# Patient Record
Sex: Female | Born: 1985 | Race: White | Hispanic: No | Marital: Married | Smoking: Current every day smoker
Health system: Southern US, Community
[De-identification: ages and names within clinical notes are randomized; demographics above are authoritative.]

## PROBLEM LIST (undated history)

## (undated) ENCOUNTER — Inpatient Hospital Stay (HOSPITAL_COMMUNITY): Payer: Self-pay

## (undated) DIAGNOSIS — Z720 Tobacco use: Secondary | ICD-10-CM

## (undated) DIAGNOSIS — F329 Major depressive disorder, single episode, unspecified: Secondary | ICD-10-CM

## (undated) DIAGNOSIS — R011 Cardiac murmur, unspecified: Secondary | ICD-10-CM

## (undated) DIAGNOSIS — F32A Depression, unspecified: Secondary | ICD-10-CM

## (undated) DIAGNOSIS — Z789 Other specified health status: Secondary | ICD-10-CM

## (undated) DIAGNOSIS — Z8489 Family history of other specified conditions: Secondary | ICD-10-CM

## (undated) DIAGNOSIS — Z973 Presence of spectacles and contact lenses: Secondary | ICD-10-CM

## (undated) HISTORY — DX: Tobacco use: Z72.0

## (undated) HISTORY — PX: GALLBLADDER SURGERY: SHX652

## (undated) HISTORY — DX: Major depressive disorder, single episode, unspecified: F32.9

## (undated) HISTORY — PX: WISDOM TOOTH EXTRACTION: SHX21

## (undated) HISTORY — DX: Depression, unspecified: F32.A

## (undated) HISTORY — PX: CHOLECYSTECTOMY: SHX55

---
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
## 2004-01-02 ENCOUNTER — Ambulatory Visit (HOSPITAL_COMMUNITY): Admission: RE | Admit: 2004-01-02 | Discharge: 2004-01-02 | Payer: Self-pay | Admitting: Obstetrics & Gynecology

## 2004-03-12 ENCOUNTER — Inpatient Hospital Stay (HOSPITAL_COMMUNITY): Admission: AD | Admit: 2004-03-12 | Discharge: 2004-03-12 | Payer: Self-pay | Admitting: Obstetrics and Gynecology

## 2004-03-15 ENCOUNTER — Inpatient Hospital Stay (HOSPITAL_COMMUNITY): Admission: AD | Admit: 2004-03-15 | Discharge: 2004-03-15 | Payer: Self-pay | Admitting: Obstetrics and Gynecology

## 2004-03-24 ENCOUNTER — Inpatient Hospital Stay (HOSPITAL_COMMUNITY): Admission: AD | Admit: 2004-03-24 | Discharge: 2004-03-27 | Payer: Self-pay | Admitting: Obstetrics & Gynecology

## 2004-03-25 ENCOUNTER — Encounter (INDEPENDENT_AMBULATORY_CARE_PROVIDER_SITE_OTHER): Payer: Self-pay | Admitting: *Deleted

## 2004-05-11 ENCOUNTER — Other Ambulatory Visit: Admission: RE | Admit: 2004-05-11 | Discharge: 2004-05-11 | Payer: Self-pay | Admitting: Obstetrics & Gynecology

## 2004-05-20 ENCOUNTER — Emergency Department (HOSPITAL_COMMUNITY): Admission: EM | Admit: 2004-05-20 | Discharge: 2004-05-20 | Payer: Self-pay | Admitting: Emergency Medicine

## 2004-09-19 ENCOUNTER — Inpatient Hospital Stay (HOSPITAL_COMMUNITY): Admission: EM | Admit: 2004-09-19 | Discharge: 2004-09-21 | Payer: Self-pay | Admitting: Emergency Medicine

## 2004-09-20 ENCOUNTER — Encounter (INDEPENDENT_AMBULATORY_CARE_PROVIDER_SITE_OTHER): Payer: Self-pay | Admitting: Specialist

## 2006-03-10 ENCOUNTER — Emergency Department (HOSPITAL_COMMUNITY): Admission: EM | Admit: 2006-03-10 | Discharge: 2006-03-10 | Payer: Self-pay | Admitting: Emergency Medicine

## 2006-03-11 ENCOUNTER — Emergency Department (HOSPITAL_COMMUNITY): Admission: EM | Admit: 2006-03-11 | Discharge: 2006-03-11 | Payer: Self-pay | Admitting: Family Medicine

## 2006-04-08 ENCOUNTER — Emergency Department (HOSPITAL_COMMUNITY): Admission: EM | Admit: 2006-04-08 | Discharge: 2006-04-08 | Payer: Self-pay | Admitting: Emergency Medicine

## 2006-12-15 ENCOUNTER — Emergency Department (HOSPITAL_COMMUNITY): Admission: EM | Admit: 2006-12-15 | Discharge: 2006-12-15 | Payer: Self-pay | Admitting: Emergency Medicine

## 2007-04-06 ENCOUNTER — Inpatient Hospital Stay (HOSPITAL_COMMUNITY): Admission: AD | Admit: 2007-04-06 | Discharge: 2007-04-06 | Payer: Self-pay | Admitting: Gynecology

## 2007-04-19 ENCOUNTER — Ambulatory Visit: Payer: Self-pay | Admitting: Family Medicine

## 2007-12-30 ENCOUNTER — Inpatient Hospital Stay (HOSPITAL_COMMUNITY): Admission: AD | Admit: 2007-12-30 | Discharge: 2007-12-30 | Payer: Self-pay | Admitting: Obstetrics & Gynecology

## 2008-01-01 ENCOUNTER — Inpatient Hospital Stay (HOSPITAL_COMMUNITY): Admission: AD | Admit: 2008-01-01 | Discharge: 2008-01-01 | Payer: Self-pay | Admitting: Obstetrics & Gynecology

## 2008-01-08 ENCOUNTER — Inpatient Hospital Stay (HOSPITAL_COMMUNITY): Admission: AD | Admit: 2008-01-08 | Discharge: 2008-01-08 | Payer: Self-pay | Admitting: Obstetrics & Gynecology

## 2008-02-27 ENCOUNTER — Inpatient Hospital Stay (HOSPITAL_COMMUNITY): Admission: AD | Admit: 2008-02-27 | Discharge: 2008-02-27 | Payer: Self-pay | Admitting: Obstetrics and Gynecology

## 2008-03-17 ENCOUNTER — Ambulatory Visit: Payer: Self-pay | Admitting: Cardiology

## 2008-03-17 ENCOUNTER — Encounter: Payer: Self-pay | Admitting: Cardiology

## 2008-03-17 DIAGNOSIS — R002 Palpitations: Secondary | ICD-10-CM | POA: Insufficient documentation

## 2008-03-17 DIAGNOSIS — R55 Syncope and collapse: Secondary | ICD-10-CM | POA: Insufficient documentation

## 2008-03-20 ENCOUNTER — Ambulatory Visit: Payer: Self-pay | Admitting: Cardiology

## 2008-03-31 ENCOUNTER — Ambulatory Visit: Payer: Self-pay

## 2008-03-31 ENCOUNTER — Encounter: Payer: Self-pay | Admitting: Cardiology

## 2008-04-29 DIAGNOSIS — R000 Tachycardia, unspecified: Secondary | ICD-10-CM

## 2008-04-29 DIAGNOSIS — R011 Cardiac murmur, unspecified: Secondary | ICD-10-CM

## 2008-07-01 ENCOUNTER — Inpatient Hospital Stay (HOSPITAL_COMMUNITY): Admission: AD | Admit: 2008-07-01 | Discharge: 2008-07-01 | Payer: Self-pay | Admitting: Obstetrics and Gynecology

## 2008-08-31 ENCOUNTER — Inpatient Hospital Stay (HOSPITAL_COMMUNITY): Admission: AD | Admit: 2008-08-31 | Discharge: 2008-09-02 | Payer: Self-pay | Admitting: Obstetrics and Gynecology

## 2009-05-08 ENCOUNTER — Emergency Department (HOSPITAL_COMMUNITY): Admission: EM | Admit: 2009-05-08 | Discharge: 2009-05-08 | Payer: Self-pay | Admitting: Pediatric Emergency Medicine

## 2010-04-17 LAB — CBC
HCT: 28.2 % — ABNORMAL LOW (ref 36.0–46.0)
HCT: 32.2 % — ABNORMAL LOW (ref 36.0–46.0)
Hemoglobin: 11.3 g/dL — ABNORMAL LOW (ref 12.0–15.0)
MCHC: 35.1 g/dL (ref 30.0–36.0)
MCHC: 35.3 g/dL (ref 30.0–36.0)
MCV: 93.7 fL (ref 78.0–100.0)
Platelets: 169 10*3/uL (ref 150–400)
RBC: 3.01 MIL/uL — ABNORMAL LOW (ref 3.87–5.11)
RBC: 3.5 MIL/uL — ABNORMAL LOW (ref 3.87–5.11)
WBC: 10.2 10*3/uL (ref 4.0–10.5)

## 2010-04-17 LAB — RH IMMUNE GLOB WKUP(>/=20WKS)(NOT WOMEN'S HOSP)

## 2010-04-19 LAB — DIFFERENTIAL
Basophils Absolute: 0 10*3/uL (ref 0.0–0.1)
Basophils Relative: 0 % (ref 0–1)
Eosinophils Relative: 2 % (ref 0–5)
Lymphocytes Relative: 16 % (ref 12–46)
Monocytes Absolute: 0.5 10*3/uL (ref 0.1–1.0)

## 2010-04-19 LAB — URINALYSIS, ROUTINE W REFLEX MICROSCOPIC
Protein, ur: NEGATIVE mg/dL
Specific Gravity, Urine: 1.01 (ref 1.005–1.030)
Urobilinogen, UA: 1 mg/dL (ref 0.0–1.0)

## 2010-04-19 LAB — COMPREHENSIVE METABOLIC PANEL
ALT: 9 U/L (ref 0–35)
AST: 17 U/L (ref 0–37)
Albumin: 2.8 g/dL — ABNORMAL LOW (ref 3.5–5.2)
Alkaline Phosphatase: 92 U/L (ref 39–117)
Chloride: 106 mEq/L (ref 96–112)
GFR calc Af Amer: >60 mL/min (ref >60)
Potassium: 3.6 mEq/L (ref 3.5–5.1)
Sodium: 136 mEq/L (ref 135–145)
Total Bilirubin: 0.8 mg/dL (ref 0.3–1.2)

## 2010-04-19 LAB — CBC
Platelets: 195 10*3/uL (ref 150–400)
WBC: 10.2 10*3/uL (ref 4.0–10.5)

## 2010-04-27 LAB — CBC
MCV: 91.2 fL (ref 78.0–100.0)
RBC: 3.6 MIL/uL — ABNORMAL LOW (ref 3.87–5.11)
WBC: 10.8 10*3/uL — ABNORMAL HIGH (ref 4.0–10.5)

## 2010-04-27 LAB — COMPREHENSIVE METABOLIC PANEL
ALT: 13 U/L (ref 0–35)
AST: 17 U/L (ref 0–37)
Alkaline Phosphatase: 35 U/L — ABNORMAL LOW (ref 39–117)
CO2: 21 mEq/L (ref 19–32)
Chloride: 100 mEq/L (ref 96–112)
GFR calc Af Amer: >60 mL/min (ref >60)
GFR calc non Af Amer: >60 mL/min (ref >60)
Potassium: 3.1 mEq/L — ABNORMAL LOW (ref 3.5–5.1)
Sodium: 130 mEq/L — ABNORMAL LOW (ref 135–145)
Total Bilirubin: 0.3 mg/dL (ref 0.3–1.2)

## 2010-04-27 LAB — URINALYSIS, ROUTINE W REFLEX MICROSCOPIC
Bilirubin Urine: NEGATIVE
Ketones, ur: NEGATIVE mg/dL
Nitrite: NEGATIVE
pH: 6.5 (ref 5.0–8.0)

## 2010-04-27 LAB — URINE MICROSCOPIC-ADD ON

## 2010-05-28 NOTE — H&P (Signed)
[name], [name]                ACCOUNT NO.:  [id number]   MEDICAL RECORD NO.:  [id number]          PATIENT TYPE:  INP   LOCATION:  1505                         FACILITY:  [facility]   PHYSICIAN:  John C. Madilyn Fireman, M.D.    DATE OF BIRTH:  1985/01/17   DATE OF ADMISSION:  09/19/2004  DATE OF DISCHARGE:                                HISTORY & PHYSICAL   CHIEF COMPLAINT:  Abdominal pain.   HISTORY OF ILLNESS:  The patient is a 25 year old white female who presents  with recurrent epigastric abdominal pain. She has been having episodes of  various intensity for the last 6 months and she had a fairly severe episode  beginning about 9 a.m. this morning with no radiation, no nausea or  vomiting. She came to the emergency room, had an abdominal ultrasound which  showed multiple gallstones and no gallbladder wall thickening or  pericholecystic fluid but some possible mild common bile duct diltation. She  also had slightly elevated liver function tests. She has had one or two ER  visits over the last 6 months for similar pain but had never had an  abdominal ultrasound. She was treated with Nexium for presumed reflux on one  occasion.   PAST MEDICAL HISTORY:  Essentially unremarkable.   SURGERIES:  None.   ALLERGIES:  Questionable allergy to a GI COCKTAIL.   SOCIAL HISTORY:  The patient is single. She denies alcohol or tobacco use.   FAMILY HISTORY:  Noncontributory. Father died of coronary artery disease.   PHYSICAL EXAMINATION:  GENERAL:  Well-developed, well-nourished, moderately-  obese white female in no acute distress. She is afebrile.  HEENT:  Unremarkable. No scleral icterus.  HEART:  Regular rate and rhythm without murmur.  LUNGS:  Clear.  ABDOMEN:  Soft, nondistended, with normoactive bowel sounds. There is mild  epigastric left upper quadrant and right upper quadrant tenderness.   LABORATORY DATA:  WBC normal. Lipase slightly elevated at 65. ALT 119, AST  102, alkaline  phosphatase and bilirubin normal.   IMPRESSION:  Symptomatic cholelithiasis, possible choledocholithiasis.   PLAN:  Will admit, consult general surgery, and decide whether to proceed  with ERCP or a laparoscopic cholecystectomy.           ______________________________  Everardo All Madilyn Fireman, M.D.     JCH/MEDQ  D:  09/19/2004  T:  09/20/2004  Job:  [id number]   cc:   Freddy Finner, M.D.  Fax: [phone]

## 2010-05-28 NOTE — Op Note (Signed)
[name], [name]                ACCOUNT NO.:  [id number]   MEDICAL RECORD NO.:  [id number]          PATIENT TYPE:  INP   LOCATION:  1505                         FACILITY:  [facility]   PHYSICIAN:  Angelia Mould. Derrell Lolling, M.D.DATE OF BIRTH:  Jul 01, 1985   DATE OF PROCEDURE:  09/20/2004  DATE OF DISCHARGE:                                 OPERATIVE REPORT   PREOPERATIVE DIAGNOSIS:  Chronic cholecystitis with cholelithiasis.   POSTOPERATIVE DIAGNOSIS:  Chronic cholecystitis with cholelithiasis.   OPERATION PERFORMED:  Laparoscopic cholecystectomy with intraoperative  cholangiogram.   SURGEON:  Angelia Mould. Derrell Lolling, M.D.   FIRST ASSISTANT:  Adolph Pollack, M.D.   OPERATIVE INDICATIONS:  This is a 25 year old white female who has a 17-month  history of intermittent episodes of biliary colic. She had the most severe  episode of a her life last night, came to the emergency room and was found  to have mildly elevated liver function tests and an ultrasound which  revealed gallstones and a slightly dilated biliary tree. Overnight she  became asymptomatic, her liver function test almost completely normalized  and her white blood cell count became normal. Her care was discussed with  Dr. Dorena Cookey. We agreed that the next step was to proceed with  cholecystectomy with the thought that she might need ERCP postop if we found  retained common bile duct stones.   OPERATIVE FINDINGS:  The patient had a chronically inflamed gallbladder. It  was slightly edematous but not severely inflamed. The anatomy of the cystic  duct, cystic artery and common bile duct were conventional. The  cholangiogram showed a dilated biliary tree, but there was no obstruction  and no filling defect. This was reviewed with the radiologist  intraoperatively. Otherwise the stomach, duodenum, small intestine, large  intestine and peritoneal surfaces looked normal. The liver looked healthy.   OPERATIVE TECHNIQUE:  Following the  induction of general endotracheal  anesthesia, the patient's abdomen was prepped and draped in a sterile  fashion. Marcaine 0.5% with epinephrine was used as a local infiltration  anesthetic. A transverse incision was made at the lower rim of the  umbilicus. The fascia was incised in the midline and the abdominal cavity  entered under direct vision. A 10 mm Hassan trocar was inserted and secured  with a pursestring suture of zero Vicryl. Pneumoperitoneum was created. A  video camera was inserted with visualization and findings as described  above. A 10 mm trocar was placed in the subxiphoid region and two 5 mm  trocars placed in the right mid abdomen. The gallbladder was elevated with  graspers. I dissected out the cystic duct and the cystic artery. A  cholangiogram catheter was inserted into the cystic duct. The cholangiogram  showed a normal anatomy of the biliary tree, although the biliary tree was  somewhat dilated. There were no filling defects, there was no obstruction  with good flow of contrast into the duodenum. The cholangiogram catheter was  removed. The cystic duct was secured with multiple metal clips and divided.  The cystic artery was secured with multiple metal clips and divided. I  found  a small posterior branch of the cystic artery as well and that was secured  with metal clips and divided. The gallbladder was then dissected from its  bed with electrocautery, placed in specimen bag and removed.   The operative field was copiously irrigated with saline. Hemostasis was  excellent and achieved with electrocautery. At the completion of the case,  the irrigation fluid was completely clear and there was no bleeding or bile  leak. The trocars were removed under direct vision and there was no bleeding  from trocar sites. The pneumoperitoneum was released. The fascia at the  umbilicus was closed with zero Vicryl sutures. The skin incision were closed  with subcuticular sutures of  4-0 Monocryl and Steri-Strips. Clean bandages  were placed and the patient taken to the recovery room in stable condition.  Estimated blood loss about 15 mL. Complications none. Sponge, instrument and  needle counts  counts were correct.      Angelia Mould. Derrell Lolling, M.D.  Electronically Signed     HMI/MEDQ  D:  09/20/2004  T:  09/20/2004  Job:  [id number]   cc:   Everardo All. Madilyn Fireman, M.D.  [address]  [place]  [place] [address]  Fax: [phone]

## 2010-05-28 NOTE — Consult Note (Signed)
[name], [name]                ACCOUNT NO.:  [id number]   MEDICAL RECORD NO.:  [id number]          PATIENT TYPE:  INP   LOCATION:  1505                         FACILITY:  [facility]   PHYSICIAN:  Angelia Mould. Derrell Lolling, M.D.DATE OF BIRTH:  10/21/1985   DATE OF CONSULTATION:  09/20/2004  DATE OF DISCHARGE:                                   CONSULTATION   REASON FOR CONSULTATION:  Evaluate abdominal pain and gallstones.   HISTORY OF PRESENT ILLNESS:  This is a 25 year old white female who gives a  6 month history of intermittent episodes of epigastric pain and nausea. She  had a severe attack yesterday and came to the emergency room last night and  was admitted by Dr. Dorena Cookey. She is now asymptomatic, denies pain or  nausea.   Her workup in the emergency room included an ultrasound which shows  gallstones and common bile duct dilated to 8 mm, but the gallbladder was not  obviously inflamed. Lab work showed a white blood cell count of 6500, SGOT  of 102 and SGPT of 119 slightly elevated, total bilirubin 0.6, lipase  slightly elevated at 65.   She was admitted by Dr. Dorena Cookey who is contemplating ERCP depending on  clinical course. I was asked to see her to assist with timing of  cholecystectomy.   PAST MEDICAL HISTORY:  She has had her wisdom teeth removed. She was told  she had a heart murmur when she was pregnant but has no real history of any  cardiac disease. She has had 2 pregnancies and 2 deliveries.   CURRENT MEDICATIONS:  None.   ALLERGIES:  She is allergic to a GI cocktail which caused the skin rash.   SOCIAL HISTORY:  She lives in [place] with her husband and 2 children.  She denies the use of alcohol or tobacco. She is a housewife.   FAMILY HISTORY:  Mother living and well, father deceased, had a myocardial  infarction and diabetes. She has 6 siblings, 1 sister had a seizure  disorder.   REVIEW OF SYMPTOMS:  A 15 system review of systems is performed and is  noncontributory except as described above.   PHYSICAL EXAMINATION:  GENERAL:  A healthy appearing young white female in  no obvious distress.  VITAL SIGNS:  Temperature 98.5, blood pressure 121/74, pulse 65,  respirations 20. Oxygen saturation 99% on room air.  HEENT:  Eyes, sclera clear, extraocular movements intact. Ears, nose, mouth,  throat, nose, lips, tongue and oropharynx are without gross lesions.  NECK:  Supple, nontender, no mass, no jugular venous distention.  LUNGS:  Clear to auscultation, no chest wall tenderness.  BREASTS:  Not examined.  HEART:  Regular rate and rhythm, perhaps a faint systolic ejection murmur  but nothing dramatic. Radial, femoral and posterior tibial pulses are  palpable, no peripheral edema.  ABDOMEN:  Soft and nontender. Liver and spleen not enlarged. No mass. Not  distended, no hernia.  EXTREMITIES:  She moves all four extremities well without pain or deformity.  NEUROLOGIC:  No gross motor or sensory deficits.   ADMISSION DATA:  Lab work and ultrasound are described above.   IMPRESSION:  1.  Chronic cholecystitis with cholelithiasis now with accelerating biliary      colic.  2.  Mild elevation of liver function tests and mild dilatation of common      bile duct on ultrasound. Question whether she may have retained common      bile duct stones.   PLAN:  1.  The patient will be started on intravenous antibiotics in case there is      an inflammatory component to this.  2.  We will repeat her lab work and clinical assessment tomorrow morning. If      her liver function tests worsen, we should consider preop ERCP. If      things improve, then we might consider cholecystectomy tomorrow.   I discussed the indications and details of cholecystectomy with the patient  and her husband. The risks and complications have been outlined, including  but not limited to bleeding, infection, conversion to open laparotomy,  injury to adjacent organs such as the  main bile duct or intestine with major  reconstructive surgery, wound problems such as infection or hernia, cardiac,  pulmonary and thromboembolic problems. She seems to understand all these  issues well. At this time, all of her questions are answered. She is in full  agreement with this plan.      Angelia Mould. Derrell Lolling, M.D.  Electronically Signed     HMI/MEDQ  D:  09/20/2004  T:  09/20/2004  Job:  [id number]   cc:   Everardo All. Madilyn Fireman, M.D.  [address]  [place]  [place] [address]  Fax: [phone]

## 2010-05-28 NOTE — Discharge Summary (Signed)
NAMEKYLYN, MCDADE                ACCOUNT NO.:  [id number]   MEDICAL RECORD NO.:  [id number]          PATIENT TYPE:  INP   LOCATION:  9101                          FACILITY:  [facility]   PHYSICIAN:  Miguel Aschoff, M.D.       DATE OF BIRTH:  [birth year]   DATE OF ADMISSION:  09/12/2003  DATE OF DISCHARGE:  09/12/2003                                 DISCHARGE SUMMARY   ADMISSION DIAGNOSES:  1.  Intrauterine pregnancy at 12 weeks.  2.  Hyperemesis with tachycardia and orthostatic changes.   HISTORY OF PRESENT ILLNESS:  The patient is a 25 year old white female  gravida 2, para 1-0-0-1 at approximately [redacted] weeks gestation.  She developed  symptoms of feeling weak and faint while standing and then became very  nauseated.  The patient was brought to the [facility] emergency department where  she was treated with intravenous fluids and then [facility] to [facility] for further evaluation and treatment.  It was elected to start the  patient on IV fluids and to control her nausea with intravenous Phenergan.  Hydration continued with improvement of her symptoms, and the patient was  able to be discharged home.   DISCHARGE MEDICATIONS:  Phenergan orally.   FOLLOW UP:  She was instructed to follow up with her physicians at  [facility] and to call if there are any renewed symptoms.      AR/MEDQ  D:  10/26/2003  T:  10/27/2003  Job:  [id number]

## 2010-10-14 LAB — POCT PREGNANCY, URINE: Preg Test, Ur: POSITIVE

## 2010-10-14 LAB — URINALYSIS, ROUTINE W REFLEX MICROSCOPIC
Bilirubin Urine: NEGATIVE
Hgb urine dipstick: NEGATIVE
Specific Gravity, Urine: 1.02 (ref 1.005–1.030)
pH: 5.5 (ref 5.0–8.0)

## 2010-10-14 LAB — WET PREP, GENITAL
Trich, Wet Prep: NONE SEEN
Yeast Wet Prep HPF POC: NONE SEEN

## 2010-10-14 LAB — ABO/RH: ABO/RH(D): A NEG

## 2010-10-14 LAB — CBC
Hemoglobin: 13.1 g/dL (ref 12.0–15.0)
MCHC: 35.1 g/dL (ref 30.0–36.0)
MCV: 96.1 fL (ref 78.0–100.0)
RBC: 3.87 MIL/uL (ref 3.87–5.11)
WBC: 6.9 10*3/uL (ref 4.0–10.5)

## 2010-10-14 LAB — GC/CHLAMYDIA PROBE AMP, GENITAL: GC Probe Amp, Genital: NEGATIVE

## 2010-11-03 ENCOUNTER — Encounter (HOSPITAL_COMMUNITY): Payer: Self-pay | Admitting: *Deleted

## 2010-11-03 ENCOUNTER — Inpatient Hospital Stay (HOSPITAL_COMMUNITY)
Admission: AD | Admit: 2010-11-03 | Discharge: 2010-11-03 | Disposition: A | Discharge status: Home / Self Care | Payer: Self-pay | Source: Ambulatory Visit | Attending: Obstetrics and Gynecology | Admitting: Obstetrics and Gynecology

## 2010-11-03 DIAGNOSIS — N946 Dysmenorrhea, unspecified: Secondary | ICD-10-CM | POA: Insufficient documentation

## 2010-11-03 DIAGNOSIS — R109 Unspecified abdominal pain: Secondary | ICD-10-CM | POA: Insufficient documentation

## 2010-11-03 HISTORY — DX: Other specified health status: Z78.9

## 2010-11-03 LAB — URINE MICROSCOPIC-ADD ON

## 2010-11-03 LAB — URINALYSIS, ROUTINE W REFLEX MICROSCOPIC
Bilirubin Urine: NEGATIVE
Glucose, UA: NEGATIVE mg/dL
Ketones, ur: NEGATIVE mg/dL
Protein, ur: NEGATIVE mg/dL

## 2010-11-03 LAB — WET PREP, GENITAL: Yeast Wet Prep HPF POC: NONE SEEN

## 2010-11-03 MED ORDER — IBUPROFEN 600 MG PO TABS: 600.0000 mg | ORAL_TABLET | Freq: Four times a day (QID) | ORAL | Status: AC | PRN

## 2010-11-03 NOTE — Progress Notes (Signed)
Patient states she started her period on 10-20. Discovered this am that she left her tampon in and removed it. Has a foul odor with slight discharge and cramping. Has had nausea all day today with vomiting x 1.

## 2010-11-03 NOTE — Progress Notes (Signed)
Pt states she had a tampon that was left in x2 days, fell out this am. Pt started her cycle on 10/20 and is only spotting at this time.  Pt states she has x1 episode of n/v and clear, odorous discharge. No fever.

## 2010-11-03 NOTE — ED Provider Notes (Signed)
History     CSN: [id number] Arrival date & time: 11/03/2010  5:54 PM   None     Chief Complaint  Patient presents with  . Vaginal Discharge  . Abdominal Cramping    HPI [name] is a 25 y.o. female who presents to [facility]U after realizing she had left a tampon in for 2 days. She is having her period and started cramping as usual when it started and is still having some cramping. She came because she was afraid of toxic shock syndrome. She denies fever, chills or other problems.   Past Medical History  Diagnosis Date  . No pertinent past medical history     Past Surgical History  Procedure Date  . Gallbladder surgery     No family history on file.  History  Substance Use Topics  . Smoking status: Current Everyday Smoker -- 1.0 packs/day  . Smokeless tobacco: Not on file  . Alcohol Use: No    OB History    Grav Para Term Preterm Abortions TAB SAB Ect Mult Living   3 3 3       3       Review of Systems  Gastrointestinal:       Abdominal cramping.  Genitourinary: Positive for vaginal bleeding and vaginal discharge.  Musculoskeletal: Positive for back pain.  All other systems reviewed and are negative.    Allergies  Donnatal; Lidocaine viscous; and Maalox  Home Medications  No current outpatient prescriptions on file.  BP 115/74  Pulse 80  Temp(Src) 98.9 F (37.2 C) (Oral)  Resp 16  Ht 5' 2.5" (1.588 m)  Wt 177 lb (80.287 kg)  BMI 31.86 kg/m2  SpO2 97%  LMP 10/30/2010  Physical Exam  Nursing note and vitals reviewed. Constitutional: She is oriented to person, place, and time. She appears well-developed and well-nourished.  HENT:  Head: Normocephalic and atraumatic.  Eyes: EOM are normal.  Neck: Neck supple.  Cardiovascular: Normal rate.   Pulmonary/Chest: Effort normal.  Abdominal: Soft. There is no tenderness.  Genitourinary:       External genitalia without lesions. Small amount of vaginal bleeding. No CMT, no adnexal tenderness. Uterus  without palpable enlargement.  Musculoskeletal: Normal range of motion.  Neurological: She is alert and oriented to person, place, and time. No cranial nerve deficit.  Skin: Skin is warm and dry.   Assessment:  Dysmenorrhea  Plan:   Ibuprofen 600 mg. Every 6 hours as needed    Cultures pending    Follow up with GYN, return here as needed. ED Course  Procedures Results for orders placed during the hospital encounter of 11/03/10 (from the past 24 hour(s))  URINALYSIS, ROUTINE W REFLEX MICROSCOPIC     Status: Abnormal   Collection Time   11/03/10  6:15 PM      Component Value Range   Color, Urine YELLOW  YELLOW    Appearance CLEAR  CLEAR    Specific Gravity, Urine >1.030 (*) 1.005 - 1.030    pH 6.0  5.0 - 8.0    Glucose, UA NEGATIVE  NEGATIVE (mg/dL)   Hgb urine dipstick MODERATE (*) NEGATIVE    Bilirubin Urine NEGATIVE  NEGATIVE    Ketones, ur NEGATIVE  NEGATIVE (mg/dL)   Protein, ur NEGATIVE  NEGATIVE (mg/dL)   Urobilinogen, UA 0.2  0.0 - 1.0 (mg/dL)   Nitrite NEGATIVE  NEGATIVE    Leukocytes, UA NEGATIVE  NEGATIVE   URINE MICROSCOPIC-ADD ON     Status: Abnormal  Collection Time   11/03/10  6:15 PM      Component Value Range   Squamous Epithelial / LPF FEW (*) RARE    WBC, UA 0-2  <3 (WBC/hpf)  WET PREP, GENITAL     Status: Abnormal   Collection Time   11/03/10  6:45 PM      Component Value Range   Yeast, Wet Prep NONE SEEN  NONE SEEN    Trich, Wet Prep NONE SEEN  NONE SEEN    Clue Cells, Wet Prep NONE SEEN  NONE SEEN    WBC, Wet Prep HPF POC MODERATE (*) NONE SEEN           [place], NP 11/03/10 1907

## 2010-11-04 LAB — GC/CHLAMYDIA PROBE AMP, GENITAL: GC Probe Amp, Genital: NEGATIVE

## 2010-11-08 NOTE — ED Provider Notes (Signed)
Agree with above note.  [name] 11/08/2010 11:38 AM

## 2010-11-13 ENCOUNTER — Inpatient Hospital Stay (INDEPENDENT_AMBULATORY_CARE_PROVIDER_SITE_OTHER)
Admission: RE | Admit: 2010-11-13 | Discharge: 2010-11-13 | Disposition: A | Discharge status: Home / Self Care | Payer: BC Managed Care – PPO | Source: Ambulatory Visit | Attending: Family Medicine | Admitting: Family Medicine

## 2010-11-13 ENCOUNTER — Ambulatory Visit (INDEPENDENT_AMBULATORY_CARE_PROVIDER_SITE_OTHER): Payer: BC Managed Care – PPO

## 2010-11-13 DIAGNOSIS — J40 Bronchitis, not specified as acute or chronic: Secondary | ICD-10-CM

## 2012-04-05 ENCOUNTER — Encounter (HOSPITAL_COMMUNITY): Payer: Self-pay | Admitting: Emergency Medicine

## 2012-04-05 ENCOUNTER — Emergency Department (HOSPITAL_COMMUNITY): Payer: BC Managed Care – PPO

## 2012-04-05 ENCOUNTER — Emergency Department (HOSPITAL_COMMUNITY)
Admission: EM | Admit: 2012-04-05 | Discharge: 2012-04-05 | Disposition: A | Discharge status: Home / Self Care | Payer: BC Managed Care – PPO | Attending: Emergency Medicine | Admitting: Emergency Medicine

## 2012-04-05 DIAGNOSIS — Y9389 Activity, other specified: Secondary | ICD-10-CM | POA: Insufficient documentation

## 2012-04-05 DIAGNOSIS — M545 Low back pain: Secondary | ICD-10-CM

## 2012-04-05 DIAGNOSIS — Y92410 Unspecified street and highway as the place of occurrence of the external cause: Secondary | ICD-10-CM | POA: Insufficient documentation

## 2012-04-05 DIAGNOSIS — F17200 Nicotine dependence, unspecified, uncomplicated: Secondary | ICD-10-CM | POA: Insufficient documentation

## 2012-04-05 DIAGNOSIS — IMO0002 Reserved for concepts with insufficient information to code with codable children: Secondary | ICD-10-CM | POA: Insufficient documentation

## 2012-04-05 MED ORDER — IBUPROFEN 600 MG PO TABS: 600.0000 mg | ORAL_TABLET | Freq: Four times a day (QID) | ORAL | Status: DC | PRN

## 2012-04-05 MED ORDER — METHOCARBAMOL 500 MG PO TABS: 1000.0000 mg | ORAL_TABLET | Freq: Four times a day (QID) | ORAL | Status: DC

## 2012-04-05 MED ORDER — IBUPROFEN 200 MG PO TABS: 600.0000 mg | ORAL_TABLET | Freq: Once | ORAL | Status: DC

## 2012-04-05 NOTE — ED Notes (Signed)
Patient transported to X-ray 
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
## 2012-04-05 NOTE — ED Provider Notes (Signed)
Medical screening examination/treatment/procedure(s) were performed by non-physician practitioner and as supervising physician I was immediately available for consultation/collaboration. Berley Gambrell, MD, FACEP   Freida Nebel L Devony Mcgrady, MD 04/05/12 2030 
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
## 2012-04-05 NOTE — ED Notes (Signed)
MD at bedside. 

## 2013-06-27 LAB — OB RESULTS CONSOLE GC/CHLAMYDIA
CHLAMYDIA, DNA PROBE: NEGATIVE
GC PROBE AMP, GENITAL: NEGATIVE

## 2013-06-27 LAB — OB RESULTS CONSOLE RPR: RPR: NONREACTIVE

## 2013-06-27 LAB — OB RESULTS CONSOLE RUBELLA ANTIBODY, IGM: Rubella: IMMUNE

## 2013-06-27 LAB — OB RESULTS CONSOLE HIV ANTIBODY (ROUTINE TESTING): HIV: NONREACTIVE

## 2013-06-27 LAB — OB RESULTS CONSOLE HEPATITIS B SURFACE ANTIGEN: HEP B S AG: NEGATIVE

## 2013-06-27 LAB — OB RESULTS CONSOLE ABO/RH: RH TYPE: NEGATIVE

## 2013-06-27 LAB — OB RESULTS CONSOLE ANTIBODY SCREEN: ANTIBODY SCREEN: NEGATIVE

## 2013-11-05 ENCOUNTER — Emergency Department (INDEPENDENT_AMBULATORY_CARE_PROVIDER_SITE_OTHER)
Admission: EM | Admit: 2013-11-05 | Discharge: 2013-11-05 | Disposition: A | Discharge status: Home / Self Care | Payer: BC Managed Care – PPO | Source: Home / Self Care | Attending: Family Medicine | Admitting: Family Medicine

## 2013-11-05 ENCOUNTER — Encounter (HOSPITAL_COMMUNITY): Payer: Self-pay | Admitting: Emergency Medicine

## 2013-11-05 DIAGNOSIS — R000 Tachycardia, unspecified: Secondary | ICD-10-CM

## 2013-11-05 DIAGNOSIS — O99330 Smoking (tobacco) complicating pregnancy, unspecified trimester: Secondary | ICD-10-CM

## 2013-11-05 DIAGNOSIS — J40 Bronchitis, not specified as acute or chronic: Secondary | ICD-10-CM

## 2013-11-05 MED ORDER — ALBUTEROL SULFATE (2.5 MG/3ML) 0.083% IN NEBU
2.5000 mg | INHALATION_SOLUTION | Freq: Once | RESPIRATORY_TRACT | Status: AC
  Administered 2013-11-05: 2.5 mg via RESPIRATORY_TRACT

## 2013-11-05 MED ORDER — ALBUTEROL SULFATE HFA 108 (90 BASE) MCG/ACT IN AERS: 2.0000 | INHALATION_SPRAY | Freq: Four times a day (QID) | RESPIRATORY_TRACT | Status: AC | PRN

## 2013-11-05 MED ORDER — ALBUTEROL SULFATE (2.5 MG/3ML) 0.083% IN NEBU
INHALATION_SOLUTION | RESPIRATORY_TRACT | Status: AC
  Filled 2013-11-05: qty 3

## 2013-11-05 MED ORDER — PREDNISONE 10 MG PO TABS: 30.0000 mg | ORAL_TABLET | Freq: Every day | ORAL | Status: DC

## 2013-11-05 NOTE — Discharge Instructions (Signed)
Thank you for coming in today. Take both prednisone and albuterol. These medications can sometimes cause problems with pregnancies but I believe the risk is outweighed by the benefit. If you have questions please discuss with your OB/GYN doctor. Please cut back or quit smoking. You can take Tylenol for pain as needed.   Call or go to the emergency room if you get worse, have trouble breathing, have chest pains, or palpitations.   Acute Bronchitis Bronchitis is inflammation of the airways that extend from the windpipe into the lungs (bronchi). The inflammation often causes mucus to develop. This leads to a cough, which is the most common symptom of bronchitis.  In acute bronchitis, the condition usually develops suddenly and goes away over time, usually in a couple weeks. Smoking, allergies, and asthma can make bronchitis worse. Repeated episodes of bronchitis may cause further lung problems.  CAUSES Acute bronchitis is most often caused by the same virus that causes a cold. The virus can spread from person to person (contagious) through coughing, sneezing, and touching contaminated objects. SIGNS AND SYMPTOMS   Cough.   Fever.   Coughing up mucus.   Body aches.   Chest congestion.   Chills.   Shortness of breath.   Sore throat.  DIAGNOSIS  Acute bronchitis is usually diagnosed through a physical exam. Your health care provider will also ask you questions about your medical history. Tests, such as chest X-rays, are sometimes done to rule out other conditions.  TREATMENT  Acute bronchitis usually goes away in a couple weeks. Oftentimes, no medical treatment is necessary. Medicines are sometimes given for relief of fever or cough. Antibiotic medicines are usually not needed but may be prescribed in certain situations. In some cases, an inhaler may be recommended to help reduce shortness of breath and control the cough. A cool mist vaporizer may also be used to help thin  bronchial secretions and make it easier to clear the chest.  HOME CARE INSTRUCTIONS  Get plenty of rest.   Drink enough fluids to keep your urine clear or pale yellow (unless you have a medical condition that requires fluid restriction). Increasing fluids may help thin your respiratory secretions (sputum) and reduce chest congestion, and it will prevent dehydration.   Take medicines only as directed by your health care provider.  If you were prescribed an antibiotic medicine, finish it all even if you start to feel better.  Avoid smoking and secondhand smoke. Exposure to cigarette smoke or irritating chemicals will make bronchitis worse. If you are a smoker, consider using nicotine gum or skin patches to help control withdrawal symptoms. Quitting smoking will help your lungs heal faster.   Reduce the chances of another bout of acute bronchitis by washing your hands frequently, avoiding people with cold symptoms, and trying not to touch your hands to your mouth, nose, or eyes.   Keep all follow-up visits as directed by your health care provider.  SEEK MEDICAL CARE IF: Your symptoms do not improve after 1 week of treatment.  SEEK IMMEDIATE MEDICAL CARE IF:  You develop an increased fever or chills.   You have chest pain.   You have severe shortness of breath.  You have bloody sputum.   You develop dehydration.  You faint or repeatedly feel like you are going to pass out.  You develop repeated vomiting.  You develop a severe headache. MAKE SURE YOU:   Understand these instructions.  Will watch your condition.  Will get help right away if  you are not doing well or get worse. Document Released: 02/04/2004 Document Revised: 05/13/2013 Document Reviewed: 06/19/2012 [facility] Patient Information 2015 [place], [place]. This information is not intended to replace advice given to you by your health care provider. Make sure you discuss any questions you have with your health care  provider.

## 2013-11-05 NOTE — ED Notes (Signed)
C/o  Productive cough with thick green sputum.  Left ear pain.  States "so stuffy at night can't breath".   Denies fever, n/v/d.  No otc treatments tried.  Pt is 28 wks. Pregnant.

## 2013-11-05 NOTE — ED Provider Notes (Signed)
Christina Porteousatricia L Stoltzfus is a 28 y.o. female who presents to Urgent Care today for cough congestion and ear pain. Patient has a one-week history of mild cough and a 3 day history of productive cough associated with wheezing and occasional shortness of breath and left ear pain. She denies any chest pain or palpitations. No fevers or chills nausea vomiting or diarrhea. No history of asthma. Patient is a smoker.   Past Medical History  Diagnosis Date  . No pertinent past medical history    History  Substance Use Topics  . Smoking status: Current Every Day Smoker -- 1.00 packs/day  . Smokeless tobacco: Not on file  . Alcohol Use: No   ROS as above Medications: Current Facility-Administered Medications  Medication Dose Route Frequency Provider Last Rate Last Dose  . albuterol (PROVENTIL) (2.5 MG/3ML) 0.083% nebulizer solution 2.5 mg  2.5 mg Nebulization Once Rodolph BongEvan S Talani Brazee, MD       Current Outpatient Prescriptions  Medication Sig Dispense Refill  . ibuprofen (ADVIL,MOTRIN) 600 MG tablet Take 1 tablet (600 mg total) by mouth every 6 (six) hours as needed for pain.  20 tablet  0  . methocarbamol (ROBAXIN) 500 MG tablet Take 2 tablets (1,000 mg total) by mouth 4 (four) times daily.  20 tablet  0    Exam:  BP 109/58  Pulse 120  Temp(Src) 98.3 F (36.8 C) (Oral)  Resp 16  SpO2 98% Gen: Well NAD HEENT: EOMI,  MMM normal tympanic membrane and posterior pharynx. Lungs: Normal work of breathing. Rhonchi present bilaterally, Heart: RRR no MRG Abd: NABS, Soft. Nondistended, Nontender Exts: Brisk capillary refill, warm and well perfused.     Patient was given 2.5 mg of albuterol nebulizer treatment. She felt better.  No results found for this or any previous visit (from the past 24 hour(s)). No results found.  Assessment and Plan: [age] y.o. female with bronchitis likely related to smoking. We had discussion about risks and benefits of albuterol in pregnancy. Feel that albuterol and prednisone are  reasonable situation despite minimal risk. Plan to treat with albuterol and prednisone. Recommend patient quit smoking. Follow up with OB/GYN as needed.  Discussed warning signs or symptoms. Please see discharge instructions. Patient expresses understanding.     Rodolph BongEvan S Etna Forquer, MD 11/05/13 1213

## 2013-11-11 ENCOUNTER — Encounter (HOSPITAL_COMMUNITY): Payer: Self-pay | Admitting: Emergency Medicine

## 2013-11-15 ENCOUNTER — Encounter (HOSPITAL_COMMUNITY): Payer: Self-pay | Admitting: *Deleted

## 2013-11-15 ENCOUNTER — Inpatient Hospital Stay (HOSPITAL_COMMUNITY)
Admission: AD | Admit: 2013-11-15 | Discharge: 2013-11-15 | Disposition: A | Discharge status: Home / Self Care | Payer: BC Managed Care – PPO | Source: Ambulatory Visit | Attending: Obstetrics and Gynecology | Admitting: Obstetrics and Gynecology

## 2013-11-15 DIAGNOSIS — R110 Nausea: Secondary | ICD-10-CM | POA: Diagnosis not present

## 2013-11-15 DIAGNOSIS — Z3A30 30 weeks gestation of pregnancy: Secondary | ICD-10-CM | POA: Diagnosis not present

## 2013-11-15 DIAGNOSIS — F17210 Nicotine dependence, cigarettes, uncomplicated: Secondary | ICD-10-CM | POA: Diagnosis not present

## 2013-11-15 DIAGNOSIS — O26893 Other specified pregnancy related conditions, third trimester: Secondary | ICD-10-CM | POA: Diagnosis not present

## 2013-11-15 LAB — URINALYSIS, ROUTINE W REFLEX MICROSCOPIC
Bilirubin Urine: NEGATIVE
GLUCOSE, UA: NEGATIVE mg/dL
HGB URINE DIPSTICK: NEGATIVE
Ketones, ur: 15 mg/dL — AB
LEUKOCYTES UA: NEGATIVE
Nitrite: NEGATIVE
PROTEIN: 100 mg/dL — AB
SPECIFIC GRAVITY, URINE: 1.015 (ref 1.005–1.030)
UROBILINOGEN UA: 0.2 mg/dL (ref 0.0–1.0)
pH: 7 (ref 5.0–8.0)

## 2013-11-15 LAB — URINE MICROSCOPIC-ADD ON

## 2013-11-15 MED ORDER — ONDANSETRON HCL 4 MG PO TABS: 4.0000 mg | ORAL_TABLET | Freq: Four times a day (QID) | ORAL | Status: DC

## 2013-11-15 MED ORDER — ONDANSETRON 8 MG PO TBDP
8.0000 mg | ORAL_TABLET | Freq: Once | ORAL | Status: AC
  Administered 2013-11-15: 8 mg via ORAL
  Filled 2013-11-15: qty 1

## 2013-11-15 NOTE — MAU Provider Note (Signed)
History     CSN: [id number]  Arrival date and time: 11/15/13 1346   First Provider Initiated Contact with Patient 11/15/13 1418        Chief Complaint  Patient presents with  . Nausea   HPI Ms. [name] is a 28 y.o. [phone] at 3831w2d who presents to MAU today with complaint of nausea and dizziness since this morning. The patient had a tooth extraction earlier today with local anesthesia. She states that since returning home she has had nausea without vomiting, fells cold and dizzy. She denies fever, vaginal bleeding, discharge, LOF or contractions. She reports good fetal movement.   OB History    Gravida Para Term Preterm AB TAB SAB Ectopic Multiple Living   4 3 3       3       Past Medical History  Diagnosis Date  . No pertinent past medical history     Past Surgical History  Procedure Laterality Date  . Gallbladder surgery    . Wisdom tooth extraction      History reviewed. No pertinent family history.  History  Substance Use Topics  . Smoking status: Current Every Day Smoker -- 1.00 packs/day  . Smokeless tobacco: Not on file  . Alcohol Use: No    Allergies:  Allergies  Allergen Reactions  . Bupivacaine Hives    Patient states she's allergic to epidurals.  . Calcium Carbonate Antacid Hives  . Lidocaine Viscous Hives  . Pb-Hyoscy-Atropine-Scopolamine Hives          No prescriptions prior to admission    Review of Systems  Constitutional: Negative for fever and malaise/fatigue.  Gastrointestinal: Negative for abdominal pain.  Genitourinary:       Neg - vaginal bleeding, discharge, LOF   Physical Exam   Blood pressure 128/72, pulse 103, temperature 98.3 F (36.8 C), temperature source Oral, resp. rate 20, SpO2 97 %.  Physical Exam  Constitutional: She is oriented to person, place, and time. She appears well-developed and well-nourished. No distress.  HENT:  Head: Normocephalic.  Cardiovascular: Normal rate.   Respiratory: Effort normal.   GI: Soft. She exhibits no distension and no mass. There is no tenderness. There is no rebound and no guarding.  Neurological: She is alert and oriented to person, place, and time.  Skin: Skin is warm and dry. No erythema.  Psychiatric: She has a normal mood and affect.   Results for orders placed or performed during the hospital encounter of 11/15/13 (from the past 24 hour(s))  Urinalysis, Routine w reflex microscopic     Status: Abnormal   Collection Time: 11/15/13  2:00 PM  Result Value Ref Range   Color, Urine YELLOW YELLOW   APPearance CLEAR CLEAR   Specific Gravity, Urine 1.015 1.005 - 1.030   pH 7.0 5.0 - 8.0   Glucose, UA NEGATIVE NEGATIVE mg/dL   Hgb urine dipstick NEGATIVE NEGATIVE   Bilirubin Urine NEGATIVE NEGATIVE   Ketones, ur 15 (A) NEGATIVE mg/dL   Protein, ur 147100 (A) NEGATIVE mg/dL   Urobilinogen, UA 0.2 0.0 - 1.0 mg/dL   Nitrite NEGATIVE NEGATIVE   Leukocytes, UA NEGATIVE NEGATIVE  Urine microscopic-add on     Status: Abnormal   Collection Time: 11/15/13  2:00 PM  Result Value Ref Range   Squamous Epithelial / LPF MANY (A) RARE   WBC, UA 3-6 <3 WBC/hpf   RBC / HPF 0-2 <3 RBC/hpf   Bacteria, UA RARE RARE   Casts GRANULAR CAST (A) NEGATIVE  Fetal Monitoring: Baseline: 150 bpm, moderate variability, + accelerations, one variable decelerations noted.  Contractions: none  MAU Course  Procedures None  MDM 8 mg ODT Zofran given. Patient reports resolution of symptoms Discussed patient with Dr. Rana SnareLowe. Ok for discharge. Follow-up as scheduled  Assessment and Plan  A: SIUP at 5194w2d Nausea  P: Discharge home Rx for Zofran given to patient to be used PRN Preterm labor precautions discussed Patient advised to follow-up with [facility] as scheduled for routine prenatal care or sooner PRN Patient may return to MAU as needed or if her condition were to change or worsen  Marny LowensteinJulie N Wenzel, PA-C  11/15/2013, 5:28 PM

## 2013-11-15 NOTE — Discharge Instructions (Signed)

## 2013-11-15 NOTE — MAU Note (Signed)
Had tooth pulled this morning.  Now feeling nauseated.  Feels like she is going to faint.

## 2013-12-19 ENCOUNTER — Encounter (HOSPITAL_COMMUNITY): Payer: Self-pay

## 2013-12-19 ENCOUNTER — Inpatient Hospital Stay (HOSPITAL_COMMUNITY)
Admission: AD | Admit: 2013-12-19 | Discharge: 2013-12-19 | Disposition: A | Discharge status: Home / Self Care | Payer: BC Managed Care – PPO | Source: Ambulatory Visit | Attending: Obstetrics and Gynecology | Admitting: Obstetrics and Gynecology

## 2013-12-19 DIAGNOSIS — O4703 False labor before 37 completed weeks of gestation, third trimester: Secondary | ICD-10-CM | POA: Diagnosis not present

## 2013-12-19 DIAGNOSIS — O99333 Smoking (tobacco) complicating pregnancy, third trimester: Secondary | ICD-10-CM | POA: Insufficient documentation

## 2013-12-19 DIAGNOSIS — Z3A35 35 weeks gestation of pregnancy: Secondary | ICD-10-CM | POA: Diagnosis not present

## 2013-12-19 DIAGNOSIS — R109 Unspecified abdominal pain: Secondary | ICD-10-CM | POA: Diagnosis present

## 2013-12-19 LAB — URINE MICROSCOPIC-ADD ON

## 2013-12-19 LAB — URINALYSIS, ROUTINE W REFLEX MICROSCOPIC
BILIRUBIN URINE: NEGATIVE
Glucose, UA: NEGATIVE mg/dL
HGB URINE DIPSTICK: NEGATIVE
KETONES UR: NEGATIVE mg/dL
Nitrite: NEGATIVE
PH: 7.5 (ref 5.0–8.0)
Protein, ur: NEGATIVE mg/dL
SPECIFIC GRAVITY, URINE: 1.01 (ref 1.005–1.030)
Urobilinogen, UA: 0.2 mg/dL (ref 0.0–1.0)

## 2013-12-19 MED ORDER — TERBUTALINE SULFATE 1 MG/ML IJ SOLN
0.2500 mg | Freq: Once | INTRAMUSCULAR | Status: AC
  Administered 2013-12-19: 0.25 mg via SUBCUTANEOUS
  Filled 2013-12-19: qty 1

## 2013-12-19 NOTE — MAU Provider Note (Signed)
History     CSN: [id number]  Arrival date and time: 12/19/13 2007   First Provider Initiated Contact with Patient 12/19/13 2058      Chief Complaint  Patient presents with  . Back Pain  . Abdominal Cramping   HPI Horace Porteousatricia L Bibb 28 y.o. [id number] @[redacted]w[redacted]d  presents to MAU complaining of lower abdominal pain and low back pain that started yesterday.  She feels like it worsened this evening to a 4 or 5/10.  She denies LOF, vaginal bleeding, dysuria, nausea, vomiting, diarrhea, dizziness, weakness.  She declines medication for pain at this time.   OB History    Gravida Para Term Preterm AB TAB SAB Ectopic Multiple Living   4 3 3       3       Past Medical History  Diagnosis Date  . No pertinent past medical history   . Medical history non-contributory     Past Surgical History  Procedure Laterality Date  . Gallbladder surgery    . Wisdom tooth extraction    . Cholecystectomy      History reviewed. No pertinent family history.  History  Substance Use Topics  . Smoking status: Current Every Day Smoker -- 1.00 packs/day  . Smokeless tobacco: Not on file  . Alcohol Use: No    Allergies:  Allergies  Allergen Reactions  . Bupivacaine Hives    Patient states she's allergic to epidurals.  . Lidocaine Viscous Hives  . Other Hives    GI cocktail  . Pb-Hyoscy-Atropine-Scopolamine Hives          Prescriptions prior to admission  Medication Sig Dispense Refill Last Dose  . acetaminophen (TYLENOL) 500 MG tablet Take 1,000 mg by mouth every 6 (six) hours as needed for mild pain.   12/18/2013 at Unknown time  . calcium carbonate (TUMS - DOSED IN MG ELEMENTAL CALCIUM) 500 MG chewable tablet Chew 1 tablet by mouth 2 (two) times daily as needed for indigestion.    Past Week at Unknown time  . albuterol (PROVENTIL HFA;VENTOLIN HFA) 108 (90 BASE) MCG/ACT inhaler Inhale 2 puffs into the lungs every 6 (six) hours as needed for wheezing or shortness of breath. 1 Inhaler 2 Rescue  .  ondansetron (ZOFRAN) 4 MG tablet Take 1 tablet (4 mg total) by mouth every 6 (six) hours. (Patient not taking: Reported on 12/19/2013) 12 tablet 0 Not Taking at Unknown time    ROS Pertinent ROS in HPI Physical Exam   Blood pressure 120/79, pulse 107, temperature 98.4 F (36.9 C), temperature source Oral, resp. rate 18.  Physical Exam  Constitutional: She is oriented to person, place, and time. She appears well-developed and well-nourished.  HENT:  Head: Normocephalic and atraumatic.  Eyes: EOM are normal.  Neck: Normal range of motion.  Cardiovascular: Normal rate, regular rhythm and normal heart sounds.   Respiratory: Effort normal and breath sounds normal. No respiratory distress.  GI: Soft. She exhibits no distension. There is no tenderness. There is no rebound and no guarding.  Genitourinary:  Cervix is posterior/FT  Musculoskeletal: Normal range of motion.  Neurological: She is alert and oriented to person, place, and time.  Skin: Skin is warm and dry.  Psychiatric: She has a normal mood and affect.   Fetal Tracing:  Baseline:120-130 Variability:mod Accelerations: 15x15 x 2 Decelerations:none  Toco: after terb irritability with occasional irregular ctx   MAU Course  Procedures  MDM Discussed with Dr. Rana SnareLowe.  Will order SQ Terbutyline and continue to monitor pt for  one hour.  MD in agreement to send pt home if contractions subside.  One hour following terbutyline - cervix rechecked and no change detected.  Toco shows increased spacing in ctx followed by period of irritability and occasional contractions.     Assessment and Plan  A: Threatened Preterm Labor  P: Discharge to home PTL precautions  Follow up in office asap Patient may return to MAU as needed or if her condition were to change or worsen   Bertram Denvereague Clark, Zyaira Vejar E 12/19/2013, 9:00 PM

## 2013-12-19 NOTE — MAU Note (Signed)
Lower abdominal cramping & constant low back pain since yesterday. Denies LOF/vaginal bleeding. Positive fetal movement.

## 2013-12-19 NOTE — Discharge Instructions (Signed)
Preterm Labor Information °Preterm labor is when labor starts at less than 37 weeks of pregnancy. The normal length of a pregnancy is 39 to 41 weeks. °CAUSES °Often, there is no identifiable underlying cause as to why a woman goes into preterm labor. One of the most common known causes of preterm labor is infection. Infections of the uterus, cervix, vagina, amniotic sac, bladder, kidney, or even the lungs (pneumonia) can cause labor to start. Other suspected causes of preterm labor include:  °· Urogenital infections, such as yeast infections and bacterial vaginosis.   °· Uterine abnormalities (uterine shape, uterine septum, fibroids, or bleeding from the placenta).   °· A cervix that has been operated on (it may fail to stay closed).   °· Malformations in the fetus.   °· Multiple gestations (twins, triplets, and so on).   °· Breakage of the amniotic sac.   °RISK FACTORS °· Having a previous history of preterm labor.   °· Having premature rupture of membranes (PROM).   °· Having a placenta that covers the opening of the cervix (placenta previa).   °· Having a placenta that separates from the uterus (placental abruption).   °· Having a cervix that is too weak to hold the fetus in the uterus (incompetent cervix).   °· Having too much fluid in the amniotic sac (polyhydramnios).   °· Taking illegal drugs or smoking while pregnant.   °· Not gaining enough weight while pregnant.   °· Being younger than 18 and older than 29 years old.   °· Having a low socioeconomic status.   °· Being African American. °SYMPTOMS °Signs and symptoms of preterm labor include:  °· Menstrual-like cramps, abdominal pain, or back pain. °· Uterine contractions that are regular, as frequent as six in an hour, regardless of their intensity (may be mild or painful). °· Contractions that start on the top of the uterus and spread down to the lower abdomen and back.   °· A sense of increased pelvic pressure.   °· A watery or bloody mucus discharge that  comes from the vagina.   °TREATMENT °Depending on the length of the pregnancy and other circumstances, your health care provider may suggest bed rest. If necessary, there are medicines that can be given to stop contractions and to mature the fetal lungs. If labor happens before 34 weeks of pregnancy, a prolonged hospital stay may be recommended. Treatment depends on the condition of both you and the fetus.  °WHAT SHOULD YOU DO IF YOU THINK YOU ARE IN PRETERM LABOR? °Call your health care provider right away. You will need to go to the hospital to get checked immediately. °HOW CAN YOU PREVENT PRETERM LABOR IN FUTURE PREGNANCIES? °You should:  °· Stop smoking if you smoke.  °· Maintain healthy weight gain and avoid chemicals and drugs that are not necessary. °· Be watchful for any type of infection. °· Inform your health care provider if you have a known history of preterm labor. °Document Released: 03/19/2003 Document Revised: 08/29/2012 Document Reviewed: 01/30/2012 °ExitCare® Patient Information ©2015 [employer]. This information is not intended to replace advice given to you by your health care provider. Make sure you discuss any questions you have with your health care provider. ° °Pelvic Rest °Pelvic rest is sometimes recommended for women when:  °· The placenta is partially or completely covering the opening of the cervix (placenta previa). °· There is bleeding between the uterine wall and the amniotic sac in the first trimester (subchorionic hemorrhage). °· The cervix begins to open without labor starting (incompetent cervix, cervical insufficiency). °· The labor is too early (preterm   labor). °HOME CARE INSTRUCTIONS °· Do not have sexual intercourse, stimulation, or an orgasm. °· Do not use tampons, douche, or put anything in the vagina. °· Do not lift anything over 10 pounds (4.5 kg). °· Avoid strenuous activity or straining your pelvic muscles. °SEEK MEDICAL CARE IF:  °· You have any vaginal bleeding  during pregnancy. Treat this as a potential emergency. °· You have cramping pain felt low in the stomach (stronger than menstrual cramps). °· You notice vaginal discharge (watery, mucus, or bloody). °· You have a low, dull backache. °· There are regular contractions or uterine tightening. °SEEK IMMEDIATE MEDICAL CARE IF: °You have vaginal bleeding and have placenta previa.  °Document Released: 04/23/2010 Document Revised: 03/21/2011 Document Reviewed: 04/23/2010 °ExitCare® Patient Information ©2015 [employer]. This information is not intended to replace advice given to you by your health care provider. Make sure you discuss any questions you have with your health care provider. ° °

## 2013-12-23 ENCOUNTER — Inpatient Hospital Stay (HOSPITAL_COMMUNITY)
Admission: AD | Admit: 2013-12-23 | Discharge: 2013-12-23 | Disposition: A | Discharge status: Home / Self Care | Payer: BC Managed Care – PPO | Source: Ambulatory Visit | Attending: Obstetrics & Gynecology | Admitting: Obstetrics & Gynecology

## 2013-12-23 DIAGNOSIS — N949 Unspecified condition associated with female genital organs and menstrual cycle: Secondary | ICD-10-CM | POA: Insufficient documentation

## 2013-12-23 DIAGNOSIS — O4703 False labor before 37 completed weeks of gestation, third trimester: Secondary | ICD-10-CM

## 2013-12-23 DIAGNOSIS — Z3A35 35 weeks gestation of pregnancy: Secondary | ICD-10-CM | POA: Insufficient documentation

## 2013-12-23 DIAGNOSIS — R102 Pelvic and perineal pain: Secondary | ICD-10-CM

## 2013-12-23 DIAGNOSIS — O99333 Smoking (tobacco) complicating pregnancy, third trimester: Secondary | ICD-10-CM | POA: Diagnosis not present

## 2013-12-23 DIAGNOSIS — O26899 Other specified pregnancy related conditions, unspecified trimester: Secondary | ICD-10-CM

## 2013-12-23 LAB — URINE MICROSCOPIC-ADD ON

## 2013-12-23 LAB — URINALYSIS, ROUTINE W REFLEX MICROSCOPIC
Bilirubin Urine: NEGATIVE
Glucose, UA: NEGATIVE mg/dL
Hgb urine dipstick: NEGATIVE
Ketones, ur: NEGATIVE mg/dL
NITRITE: NEGATIVE
PROTEIN: NEGATIVE mg/dL
UROBILINOGEN UA: 1 mg/dL (ref 0.0–1.0)
pH: 6.5 (ref 5.0–8.0)

## 2013-12-23 MED ORDER — CYCLOBENZAPRINE HCL 5 MG PO TABS: 5.0000 mg | ORAL_TABLET | Freq: Three times a day (TID) | ORAL | Status: DC | PRN

## 2013-12-23 NOTE — MAU Note (Signed)
Patient states she is having back pain and cramping for the last couple of hours

## 2013-12-23 NOTE — Discharge Instructions (Signed)
Braxton Hicks Contractions °Contractions of the uterus can occur throughout pregnancy. Contractions are not always a sign that you are in labor.  °WHAT ARE BRAXTON HICKS CONTRACTIONS?  °Contractions that occur before labor are called Braxton Hicks contractions, or false labor. Toward the end of pregnancy (32-34 weeks), these contractions can develop more often and may become more forceful. This is not true labor because these contractions do not result in opening (dilatation) and thinning of the cervix. They are sometimes difficult to tell apart from true labor because these contractions can be forceful and people have different pain tolerances. You should not feel embarrassed if you go to the hospital with false labor. Sometimes, the only way to tell if you are in true labor is for your health care provider to look for changes in the cervix. °If there are no prenatal problems or other health problems associated with the pregnancy, it is completely safe to be sent home with false labor and await the onset of true labor. °HOW CAN YOU TELL THE DIFFERENCE BETWEEN TRUE AND FALSE LABOR? °False Labor °· The contractions of false labor are usually shorter and not as hard as those of true labor.   °· The contractions are usually irregular.   °· The contractions are often felt in the front of the lower abdomen and in the groin.   °· The contractions may go away when you walk around or change positions while lying down.   °· The contractions get weaker and are shorter lasting as time goes on.   °· The contractions do not usually become progressively stronger, regular, and closer together as with true labor.   °True Labor °· Contractions in true labor last 30-70 seconds, become very regular, usually become more intense, and increase in frequency.   °· The contractions do not go away with walking.   °· The discomfort is usually felt in the top of the uterus and spreads to the lower abdomen and low back.   °· True labor can be  determined by your health care provider with an exam. This will show that the cervix is dilating and getting thinner.   °WHAT TO REMEMBER °· Keep up with your usual exercises and follow other instructions given by your health care provider.   °· Take medicines as directed by your health care provider.   °· Keep your regular prenatal appointments.   °· Eat and drink lightly if you think you are going into labor.   °· If Braxton Hicks contractions are making you uncomfortable:   °· Change your position from lying down or resting to walking, or from walking to resting.   °· Sit and rest in a tub of warm water.   °· Drink 2-3 glasses of water. Dehydration may cause these contractions.   °· Do slow and deep breathing several times an hour.   °WHEN SHOULD I SEEK IMMEDIATE MEDICAL CARE? °Seek immediate medical care if: °· Your contractions become stronger, more regular, and closer together.   °· You have fluid leaking or gushing from your vagina.   °· You have a fever.   °· You pass blood-tinged mucus.   °· You have vaginal bleeding.   °· You have continuous abdominal pain.   °· You have low back pain that you never had before.   °· You feel your baby's head pushing down and causing pelvic pressure.   °· Your baby is not moving as much as it used to.   °Document Released: 12/27/2004 Document Revised: 01/01/2013 Document Reviewed: 10/08/2012 °[employer]® Patient Information ©2015 [employer]. This information is not intended to replace advice given to you by your health care   provider. Make sure you discuss any questions you have with your health care provider. °Abdominal Pain During Pregnancy °Abdominal pain is common in pregnancy. Most of the time, it does not cause harm. There are many causes of abdominal pain. Some causes are more serious than others. Some of the causes of abdominal pain in pregnancy are easily diagnosed. Occasionally, the diagnosis takes time to understand. Other times, the cause is not determined.  Abdominal pain can be a sign that something is very wrong with the pregnancy, or the pain may have nothing to do with the pregnancy at all. For this reason, always tell your health care provider if you have any abdominal discomfort. °HOME CARE INSTRUCTIONS  °Monitor your abdominal pain for any changes. The following actions may help to alleviate any discomfort you are experiencing: °· Do not have sexual intercourse or put anything in your vagina until your symptoms go away completely. °· Get plenty of rest until your pain improves. °· Drink clear fluids if you feel nauseous. Avoid solid food as long as you are uncomfortable or nauseous. °· Only take over-the-counter or prescription medicine as directed by your health care provider. °· Keep all follow-up appointments with your health care provider. °SEEK IMMEDIATE MEDICAL CARE IF: °· You are bleeding, leaking fluid, or passing tissue from the vagina. °· You have increasing pain or cramping. °· You have persistent vomiting. °· You have painful or bloody urination. °· You have a fever. °· You notice a decrease in your baby's movements. °· You have extreme weakness or feel faint. °· You have shortness of breath, with or without abdominal pain. °· You develop a severe headache with abdominal pain. °· You have abnormal vaginal discharge with abdominal pain. °· You have persistent diarrhea. °· You have abdominal pain that continues even after rest, or gets worse. °MAKE SURE YOU:  °· Understand these instructions. °· Will watch your condition. °· Will get help right away if you are not doing well or get worse. °Document Released: 12/27/2004 Document Revised: 10/17/2012 Document Reviewed: 07/26/2012 °[employer]® Patient Information ©2015 [employer]. This information is not intended to replace advice given to you by your health care provider. Make sure you discuss any questions you have with your health care provider. ° °

## 2013-12-23 NOTE — MAU Provider Note (Signed)
Chief Complaint:  Abdominal Cramping and Back Pain   First Provider Initiated Contact with Patient 12/23/13 1554      HPI: [name] is a 28 y.o. G4P3003 at 7457w5d who presents with irregular painful contractions. She has increased lower abdominal pain and pelvic pressure with walking. Seen here 4 days ago for same sx and tx with terbutaline in MAU. Denies contractions, leakage of fluid or vaginal bleeding. Good fetal movement. States US today with measurements 2 wks ahead.  Pregnancy Course: Rh neg, essentially uncomplicated  Past Medical History: Past Medical History  Diagnosis Date  . No pertinent past medical history   . Medical history non-contributory     Past obstetric history: OB History  Gravida Para Term Preterm AB SAB TAB Ectopic Multiple Living  4 3 3       3     # Outcome Date GA Lbr Len/2nd Weight Sex Delivery Anes PTL Lv  4 Current           3 Term 08/2008 7033w1d   Genella MechM Vag-Spont EPI  Y  2 Term 01/11/04 3651w0d  4.026 kg (8 lb 14 oz) F Vag-Spont EPI  Y  1 Term 01/2002 5470w0d  3.402 kg (7 lb 8 oz) F Vag-Spont EPI  Y      Past Surgical History: Past Surgical History  Procedure Laterality Date  . Gallbladder surgery    . Wisdom tooth extraction    . Cholecystectomy       Family History: No family history on file.  Social History: History  Substance Use Topics  . Smoking status: Current Every Day Smoker -- 1.00 packs/day  . Smokeless tobacco: Not on file  . Alcohol Use: No    Allergies:  Allergies  Allergen Reactions  . Bupivacaine Hives    Patient states she's allergic to epidurals.  . Lidocaine Viscous Hives  . Other Hives    GI cocktail  . Pb-Hyoscy-Atropine-Scopolamine Hives          Meds:  Prescriptions prior to admission  Medication Sig Dispense Refill Last Dose  . acetaminophen (TYLENOL) 500 MG tablet Take 1,000 mg by mouth every 6 (six) hours as needed for mild pain.   Past Week at Unknown time  . calcium carbonate (TUMS - DOSED IN MG  ELEMENTAL CALCIUM) 500 MG chewable tablet Chew 1 tablet by mouth 2 (two) times daily as needed for indigestion.    Past Week at Unknown time  . albuterol (PROVENTIL HFA;VENTOLIN HFA) 108 (90 BASE) MCG/ACT inhaler Inhale 2 puffs into the lungs every 6 (six) hours as needed for wheezing or shortness of breath. 1 Inhaler 2 Rescue    ROS: Pertinent findings in history of present illness.  Physical Exam  Blood pressure 105/76, pulse 100, temperature 98.4 F (36.9 C), temperature source Oral, resp. rate 18. GENERAL: Well-developed, well-nourished female in no acute distress.  HEENT: normocephalic HEART: normal rate RESP: normal effort ABDOMEN: Soft, non-tender, gravid appropriate for gestational age EXTREMITIES: Nontender, no edema NEURO: alert and oriented SPECULUM EXAM: NEFG, physiologic discharge, no blood, cervix clean   Dilation: Fingertip Effacement (%): Thick Cervical Position: Posterior Exam by:: Diedre Dillion Stowers CNM  FHT:  Baseline 140 , moderate variability, accelerations present, occ mild variable decelerations Contractions: irregular mild   Labs: Results for orders placed or performed during the hospital encounter of 12/23/13 (from the past 24 hour(s))  Urinalysis, Routine w reflex microscopic     Status: Abnormal   Collection Time: 12/23/13  2:46 PM  Result  Value Ref Range   Color, Urine YELLOW YELLOW   APPearance CLEAR CLEAR   Specific Gravity, Urine <1.005 (L) 1.005 - 1.030   pH 6.5 5.0 - 8.0   Glucose, UA NEGATIVE NEGATIVE mg/dL   Hgb urine dipstick NEGATIVE NEGATIVE   Bilirubin Urine NEGATIVE NEGATIVE   Ketones, ur NEGATIVE NEGATIVE mg/dL   Protein, ur NEGATIVE NEGATIVE mg/dL   Urobilinogen, UA 1.0 0.0 - 1.0 mg/dL   Nitrite NEGATIVE NEGATIVE   Leukocytes, UA SMALL (A) NEGATIVE  Urine microscopic-add on     Status: Abnormal   Collection Time: 12/23/13  2:46 PM  Result Value Ref Range   Squamous Epithelial / LPF FEW (A) RARE   WBC, UA 0-2 <3 WBC/hpf   Bacteria, UA  FEW (A) RARE    Imaging:  No results found. MAU Course: D/W Dr. Langston MaskerMorris  Assessment: 1. Preterm contractions, third trimester   2. Pain of round ligament affecting pregnancy, antepartum   G4P3003 at 675w5d Category 1 FHR  Plan: Discharge home with reassurance Labor precautions and fetal kick counts RLP relief measures   Medication List    TAKE these medications        acetaminophen 500 MG tablet  Commonly known as:  TYLENOL  Take 1,000 mg by mouth every 6 (six) hours as needed for mild pain.     albuterol 108 (90 BASE) MCG/ACT inhaler  Commonly known as:  PROVENTIL HFA;VENTOLIN HFA  Inhale 2 puffs into the lungs every 6 (six) hours as needed for wheezing or shortness of breath.     calcium carbonate 500 MG chewable tablet  Commonly known as:  TUMS - dosed in mg elemental calcium  Chew 1 tablet by mouth 2 (two) times daily as needed for indigestion.     cyclobenzaprine 5 MG tablet  Commonly known as:  FLEXERIL  Take 1 tablet (5 mg total) by mouth 3 (three) times daily as needed for muscle spasms.       Follow-up Information    Follow up with MORRIS, MEGAN, DO.   Specialty:  Obstetrics and Gynecology   Why:  Keep your scheduled prenatal appointment   Contact information:   [address], [address], [address] [place] [address] [phone]       Danae OrleansDeirdre C Jahnaya Branscome, CNM 12/23/2013 3:55 PM

## 2014-01-10 NOTE — L&D Delivery Note (Signed)
Delivery Note At 4:09 PM a viable female was delivered via NSVD  (Presentation:OA ;  ).  APGAR:9/9 , ; weight  .   Placenta status:INTact , .  Cord:3 VESSEL CORD  with the following complications:NON2.  Cord pH: NA  Anesthesia: Epidural  Episiotomy:  NONE Lacerations:FIRST DEGRESS   Suture Repair: 2.0 chromic Est. Blood Loss (mL):  300 CC  Mom to postpartum.  Baby to Couplet care / Skin to Skin.  Atticus Lemberger S 01/17/2014, 4:18 PM

## 2014-01-12 ENCOUNTER — Encounter (HOSPITAL_COMMUNITY): Payer: Self-pay

## 2014-01-12 ENCOUNTER — Inpatient Hospital Stay (HOSPITAL_COMMUNITY)
Admission: AD | Admit: 2014-01-12 | Discharge: 2014-01-13 | DRG: 780 | Disposition: A | Discharge status: Home / Self Care | Payer: BLUE CROSS/BLUE SHIELD | Source: Ambulatory Visit | Attending: Obstetrics and Gynecology | Admitting: Obstetrics and Gynecology

## 2014-01-12 DIAGNOSIS — Z720 Tobacco use: Secondary | ICD-10-CM

## 2014-01-12 DIAGNOSIS — Z3A38 38 weeks gestation of pregnancy: Secondary | ICD-10-CM | POA: Diagnosis present

## 2014-01-12 DIAGNOSIS — O471 False labor at or after 37 completed weeks of gestation: Principal | ICD-10-CM | POA: Diagnosis present

## 2014-01-12 HISTORY — DX: Cardiac murmur, unspecified: R01.1

## 2014-01-12 LAB — OB RESULTS CONSOLE GBS: GBS: POSITIVE

## 2014-01-12 NOTE — MAU Note (Signed)
Pt states that she has been having constant cramping and lower pressure for the past 2 hours. Pt denies leaking of fluid and bleeding and states that baby is active.

## 2014-01-13 ENCOUNTER — Encounter (HOSPITAL_COMMUNITY): Payer: Self-pay

## 2014-01-13 DIAGNOSIS — Z720 Tobacco use: Secondary | ICD-10-CM | POA: Diagnosis not present

## 2014-01-13 DIAGNOSIS — O471 False labor at or after 37 completed weeks of gestation: Secondary | ICD-10-CM | POA: Diagnosis present

## 2014-01-13 DIAGNOSIS — Z3A38 38 weeks gestation of pregnancy: Secondary | ICD-10-CM | POA: Diagnosis present

## 2014-01-13 LAB — CBC
HEMATOCRIT: 30.1 % — AB (ref 36.0–46.0)
Hemoglobin: 10.2 g/dL — ABNORMAL LOW (ref 12.0–15.0)
MCH: 30.2 pg (ref 26.0–34.0)
MCHC: 33.9 g/dL (ref 30.0–36.0)
MCV: 89.1 fL (ref 78.0–100.0)
Platelets: 263 10*3/uL (ref 150–400)
RBC: 3.38 MIL/uL — AB (ref 3.87–5.11)
RDW: 13.2 % (ref 11.5–15.5)
WBC: 12.5 10*3/uL — AB (ref 4.0–10.5)

## 2014-01-13 LAB — RPR

## 2014-01-13 LAB — URINALYSIS, ROUTINE W REFLEX MICROSCOPIC
Bilirubin Urine: NEGATIVE
GLUCOSE, UA: NEGATIVE mg/dL
HGB URINE DIPSTICK: NEGATIVE
Ketones, ur: NEGATIVE mg/dL
LEUKOCYTES UA: NEGATIVE
Nitrite: NEGATIVE
PROTEIN: NEGATIVE mg/dL
Specific Gravity, Urine: <1.005 — ABNORMAL LOW (ref 1.005–1.030)
Urobilinogen, UA: 0.2 mg/dL (ref 0.0–1.0)
pH: 6.5 (ref 5.0–8.0)

## 2014-01-13 LAB — TYPE AND SCREEN
ABO/RH(D): A NEG
Antibody Screen: NEGATIVE

## 2014-01-13 MED ORDER — OXYCODONE-ACETAMINOPHEN 5-325 MG PO TABS: 2.0000 | ORAL_TABLET | ORAL | Status: DC | PRN

## 2014-01-13 MED ORDER — OXYCODONE-ACETAMINOPHEN 5-325 MG PO TABS: 1.0000 | ORAL_TABLET | ORAL | Status: DC | PRN

## 2014-01-13 MED ORDER — LACTATED RINGERS IV SOLN: 500.0000 mL | INTRAVENOUS | Status: DC | PRN

## 2014-01-13 MED ORDER — OXYTOCIN 40 UNITS IN LACTATED RINGERS INFUSION - SIMPLE MED: 62.5000 mL/h | INTRAVENOUS | Status: DC

## 2014-01-13 MED ORDER — OXYTOCIN BOLUS FROM INFUSION: 500.0000 mL | INTRAVENOUS | Status: DC

## 2014-01-13 MED ORDER — BUTORPHANOL TARTRATE 1 MG/ML IJ SOLN
1.0000 mg | INTRAMUSCULAR | Status: DC | PRN
  Administered 2014-01-13: 1 mg via INTRAVENOUS
  Filled 2014-01-13: qty 1

## 2014-01-13 MED ORDER — ALBUTEROL SULFATE (2.5 MG/3ML) 0.083% IN NEBU: 3.0000 mL | INHALATION_SOLUTION | Freq: Four times a day (QID) | RESPIRATORY_TRACT | Status: DC | PRN

## 2014-01-13 MED ORDER — CITRIC ACID-SODIUM CITRATE 334-500 MG/5ML PO SOLN: 30.0000 mL | ORAL | Status: DC | PRN

## 2014-01-13 MED ORDER — ONDANSETRON HCL 4 MG/2ML IJ SOLN: 4.0000 mg | Freq: Four times a day (QID) | INTRAMUSCULAR | Status: DC | PRN

## 2014-01-13 MED ORDER — FLEET ENEMA 7-19 GM/118ML RE ENEM: 1.0000 | ENEMA | RECTAL | Status: DC | PRN

## 2014-01-13 MED ORDER — ACETAMINOPHEN 325 MG PO TABS
650.0000 mg | ORAL_TABLET | ORAL | Status: DC | PRN
Start: 2014-01-13 — End: 2014-01-13

## 2014-01-13 MED ORDER — LACTATED RINGERS IV SOLN
INTRAVENOUS | Status: DC
  Administered 2014-01-13: 03:00:00 via INTRAVENOUS

## 2014-01-13 NOTE — MAU Note (Signed)
Pt to walk for a couple of hours and then be reexamined.

## 2014-01-13 NOTE — Progress Notes (Signed)
[name] is a 29 y.o. [phone] at [redacted]w[redacted]d by ultrasound admitted for pain control for false labor.  Subjective: No complaints this AM.  Much more comfortable after Stadol x 1 dose overnight.    Objective: BP 128/77 mmHg  Pulse 87  Temp(Src) 98.4 F (36.9 C) (Oral)  Resp 18  Ht  (1.575 m)  Wt 204 lb (92.534 kg)  BMI 37.30 kg/m2      FHT:  FHR: 140 bpm, variability: moderate,  accelerations:  Present,  decelerations:  Absent UC:   irregular, every 6-8 minutes SVE:   Dilation: 1.5 Effacement (%): 50 Station: -2 Exam by:: Dr. Langston Masker  Labs: Lab Results  Component Value Date   WBC 12.5* 01/13/2014   HGB 10.2* 01/13/2014   HCT 30.1* 01/13/2014   MCV 89.1 01/13/2014   PLT 263 01/13/2014    Assessment / Plan: False labor-Plan to d/c home with strict labor precautions.  Will schedule AROM IOL at 39 weeks.  Labor: false labor Preeclampsia:  n/a Fetal Wellbeing:  Category I Pain Control:  Stadol x 1 I/D:  n/a Anticipated MOD:  n/a  [name] 01/13/2014, 7:54 AM

## 2014-01-13 NOTE — Discharge Summary (Signed)
Obstetric Discharge Summary Reason for Admission: observation/evaluation Prenatal Procedures: none Intrapartum Procedures: n/a Postpartum Procedures: n/a Complications-Operative and Postpartum: n/a HEMOGLOBIN  Date Value Ref Range Status  01/13/2014 10.2* 12.0 - 15.0 g/dL Final   HCT  Date Value Ref Range Status  01/13/2014 30.1* 36.0 - 46.0 % Final    Physical Exam:  General: alert, cooperative and appears stated age Lochia: appropriate Uterine Fundus: soft Incision: n/a DVT Evaluation: No evidence of DVT seen on physical exam. Negative Homan's sign. No cords or calf tenderness.  Discharge Diagnoses: False labor-undelivered  Discharge Information: Date: 01/13/2014 Activity: unrestricted Diet: routine Medications: PNV Condition: stable Instructions: refer to practice specific booklet Discharge to: home   Newborn Data: This patient has no babies on file. Home with n/a.  Dayanara Sherrill 01/13/2014, 8:00 AM

## 2014-01-13 NOTE — MAU Note (Signed)
Dr. Henderson Cloud coming to MAU to speak with patient.

## 2014-01-13 NOTE — OB Triage Note (Signed)
Discharge instructions given and discussed with patient and patients significant other- All questions answered. Patient and patients significant other both verbalized understanding. IV removed. Patient discharged home with significant other.

## 2014-01-13 NOTE — Discharge Instructions (Signed)
Return to the hospital for increased pain and frequency of contractions, leakage of fluid or decreased fetal movement.  Will call from office to schedule induction.

## 2014-01-13 NOTE — MAU Note (Signed)
Pt to be admitetd to L&D and to be given pain medication and observed throughout the night.

## 2014-01-13 NOTE — H&P (Signed)
[name] is a 29 y.o. female presenting for UCs all day. No ROM, no bleeding, no epigastric pain or H/A. Maternal Medical History:  Reason for admission: Contractions.   Contractions: Onset was 6-12 hours ago.    Fetal activity: Perceived fetal activity is normal.      OB History    Gravida Para Term Preterm AB TAB SAB Ectopic Multiple Living   Past Medical History  Diagnosis Date  . No pertinent past medical history   . Heart murmur     cardiac eval 2010   Past Surgical History  Procedure Laterality Date  . Gallbladder surgery    . Wisdom tooth extraction    . Cholecystectomy     Family History: family history includes Cancer in her paternal aunt; Diabetes in her paternal grandfather; Heart attack in her father. Social History:  reports that she has been smoking.  She does not have any smokeless tobacco history on file. She reports that she does not drink alcohol or use illicit drugs.   Prenatal Transfer Tool  Maternal Diabetes: No Genetic Screening: Normal Maternal Ultrasounds/Referrals: Normal Fetal Ultrasounds or other Referrals:  None Maternal Substance Abuse:  No Significant Maternal Medications:  None Significant Maternal Lab Results:  None Other Comments:  None  Review of Systems  Eyes: Negative for blurred vision.  Gastrointestinal: Negative for abdominal pain.  Neurological: Negative for headaches.    Dilation: 1.5 Effacement (%): 50 Station: -3 Exam by:: [employer] Blood pressure 129/72, pulse 104, temperature 98.4 F (36.9 C), temperature source Oral, resp. rate 18, height  (1.575 m), weight 202 lb (91.627 kg). Maternal Exam:  Uterine Assessment: Contraction strength is firm.  Contraction frequency is regular.   Abdomen: Patient reports no abdominal tenderness. Fetal presentation: vertex     Fetal Exam Fetal State Assessment: Category I - tracings are normal.     Physical Exam  Cardiovascular:  Normal rate.   Respiratory: Effort normal.  GI: Soft.  Neurological: She has normal reflexes.    Cx cl/50%/soft/-2/vtx Prenatal labs: ABO, Rh: A/Negative/-- (06/18 0000) Antibody: Negative (06/18 0000) Rubella: Immune (06/18 0000) RPR: Nonreactive (06/18 0000)  HBsAg: Negative (06/18 0000)  HIV: Non-reactive (06/18 0000)  GBS: Positive (01/03 0000)   Assessment/Plan: 29 yo G4P3 @ 38 5/7 weeks in labor D/W patient admission and pain meds prn All questions answered She states she understands and agrees   Avey Mcmanamon II,Vannesa Abair E 01/13/2014, 2:49 AM

## 2014-01-15 ENCOUNTER — Telehealth (HOSPITAL_COMMUNITY): Payer: Self-pay | Admitting: *Deleted

## 2014-01-15 NOTE — Telephone Encounter (Signed)
Preadmission screen  

## 2014-01-16 ENCOUNTER — Inpatient Hospital Stay (HOSPITAL_COMMUNITY)
Admission: AD | Admit: 2014-01-16 | Discharge: 2014-01-17 | Disposition: A | Discharge status: Home / Self Care | Payer: BLUE CROSS/BLUE SHIELD | Source: Ambulatory Visit | Attending: Obstetrics and Gynecology | Admitting: Obstetrics and Gynecology

## 2014-01-16 ENCOUNTER — Encounter (HOSPITAL_COMMUNITY): Payer: Self-pay | Admitting: *Deleted

## 2014-01-16 DIAGNOSIS — Z3A39 39 weeks gestation of pregnancy: Secondary | ICD-10-CM | POA: Insufficient documentation

## 2014-01-16 DIAGNOSIS — O471 False labor at or after 37 completed weeks of gestation: Secondary | ICD-10-CM | POA: Insufficient documentation

## 2014-01-16 NOTE — MAU Note (Signed)
Pt was seen in the office today and states she was 3cm.  Small amount of brownish discharge after exam.  Pt states she has been contracting off and on all day but progressively got worse around 2100.  U/C's reported to be 5 min apart.  Good fetal movement.  Denies any ROM.

## 2014-01-17 ENCOUNTER — Inpatient Hospital Stay (HOSPITAL_COMMUNITY): Payer: BLUE CROSS/BLUE SHIELD | Admitting: Anesthesiology

## 2014-01-17 ENCOUNTER — Encounter (HOSPITAL_COMMUNITY): Payer: Self-pay

## 2014-01-17 ENCOUNTER — Inpatient Hospital Stay (HOSPITAL_COMMUNITY)
Admission: RE | Admit: 2014-01-17 | Discharge: 2014-01-19 | DRG: 775 | Disposition: A | Discharge status: Home / Self Care | Payer: BLUE CROSS/BLUE SHIELD | Source: Ambulatory Visit | Attending: Obstetrics and Gynecology | Admitting: Obstetrics and Gynecology

## 2014-01-17 DIAGNOSIS — Z3490 Encounter for supervision of normal pregnancy, unspecified, unspecified trimester: Secondary | ICD-10-CM

## 2014-01-17 DIAGNOSIS — Z833 Family history of diabetes mellitus: Secondary | ICD-10-CM

## 2014-01-17 DIAGNOSIS — O99824 Streptococcus B carrier state complicating childbirth: Secondary | ICD-10-CM | POA: Diagnosis present

## 2014-01-17 DIAGNOSIS — O99334 Smoking (tobacco) complicating childbirth: Secondary | ICD-10-CM | POA: Diagnosis present

## 2014-01-17 DIAGNOSIS — O471 False labor at or after 37 completed weeks of gestation: Secondary | ICD-10-CM | POA: Diagnosis present

## 2014-01-17 DIAGNOSIS — Z8249 Family history of ischemic heart disease and other diseases of the circulatory system: Secondary | ICD-10-CM | POA: Diagnosis not present

## 2014-01-17 DIAGNOSIS — Z9049 Acquired absence of other specified parts of digestive tract: Secondary | ICD-10-CM | POA: Diagnosis present

## 2014-01-17 DIAGNOSIS — Z3A39 39 weeks gestation of pregnancy: Secondary | ICD-10-CM | POA: Diagnosis not present

## 2014-01-17 LAB — CBC
HCT: 29.3 % — ABNORMAL LOW (ref 36.0–46.0)
HEMOGLOBIN: 9.9 g/dL — AB (ref 12.0–15.0)
MCH: 29.7 pg (ref 26.0–34.0)
MCHC: 33.8 g/dL (ref 30.0–36.0)
MCV: 88 fL (ref 78.0–100.0)
PLATELETS: 244 10*3/uL (ref 150–400)
RBC: 3.33 MIL/uL — ABNORMAL LOW (ref 3.87–5.11)
RDW: 13.2 % (ref 11.5–15.5)
WBC: 8.9 10*3/uL (ref 4.0–10.5)

## 2014-01-17 LAB — SURGICAL PCR SCREEN
MRSA, PCR: NEGATIVE
Staphylococcus aureus: NEGATIVE

## 2014-01-17 LAB — TYPE AND SCREEN
ABO/RH(D): A NEG
ANTIBODY SCREEN: NEGATIVE

## 2014-01-17 MED ORDER — OXYCODONE-ACETAMINOPHEN 5-325 MG PO TABS: 2.0000 | ORAL_TABLET | ORAL | Status: DC | PRN

## 2014-01-17 MED ORDER — PHENYLEPHRINE 40 MCG/ML (10ML) SYRINGE FOR IV PUSH (FOR BLOOD PRESSURE SUPPORT)
80.0000 ug | PREFILLED_SYRINGE | INTRAVENOUS | Status: DC | PRN
Start: 2014-01-17 — End: 2014-01-17

## 2014-01-17 MED ORDER — LACTATED RINGERS IV SOLN: 500.0000 mL | INTRAVENOUS | Status: DC | PRN

## 2014-01-17 MED ORDER — ZOLPIDEM TARTRATE 5 MG PO TABS: 5.0000 mg | ORAL_TABLET | Freq: Every evening | ORAL | Status: DC | PRN

## 2014-01-17 MED ORDER — IBUPROFEN 600 MG PO TABS
600.0000 mg | ORAL_TABLET | Freq: Four times a day (QID) | ORAL | Status: DC
  Administered 2014-01-17 – 2014-01-19 (×6): 600 mg via ORAL
  Filled 2014-01-17 (×6): qty 1

## 2014-01-17 MED ORDER — DIPHENHYDRAMINE HCL 50 MG/ML IJ SOLN: 12.5000 mg | INTRAMUSCULAR | Status: DC | PRN

## 2014-01-17 MED ORDER — WITCH HAZEL-GLYCERIN EX PADS: 1.0000 "application " | MEDICATED_PAD | CUTANEOUS | Status: DC | PRN

## 2014-01-17 MED ORDER — FENTANYL 2.5 MCG/ML BUPIVACAINE 1/10 % EPIDURAL INFUSION (WH - ANES)
INTRAMUSCULAR | Status: DC | PRN
  Administered 2014-01-17: 14 mL/h via EPIDURAL

## 2014-01-17 MED ORDER — OXYTOCIN 40 UNITS IN LACTATED RINGERS INFUSION - SIMPLE MED: 62.5000 mL/h | INTRAVENOUS | Status: DC

## 2014-01-17 MED ORDER — LACTATED RINGERS IV SOLN: 500.0000 mL | Freq: Once | INTRAVENOUS | Status: DC

## 2014-01-17 MED ORDER — DIBUCAINE 1 % RE OINT
1.0000 "application " | TOPICAL_OINTMENT | RECTAL | Status: DC | PRN
  Administered 2014-01-18: 1 via RECTAL
  Filled 2014-01-17: qty 28

## 2014-01-17 MED ORDER — DIPHENHYDRAMINE HCL 25 MG PO CAPS: 25.0000 mg | ORAL_CAPSULE | Freq: Four times a day (QID) | ORAL | Status: DC | PRN

## 2014-01-17 MED ORDER — LACTATED RINGERS IV SOLN
INTRAVENOUS | Status: DC
  Administered 2014-01-17 (×2): 1000 mL via INTRAVENOUS

## 2014-01-17 MED ORDER — PRENATAL MULTIVITAMIN CH
1.0000 | ORAL_TABLET | Freq: Every day | ORAL | Status: DC
  Administered 2014-01-18: 1 via ORAL
  Filled 2014-01-17: qty 1

## 2014-01-17 MED ORDER — ONDANSETRON HCL 4 MG/2ML IJ SOLN: 4.0000 mg | INTRAMUSCULAR | Status: DC | PRN

## 2014-01-17 MED ORDER — OXYCODONE-ACETAMINOPHEN 5-325 MG PO TABS
1.0000 | ORAL_TABLET | ORAL | Status: DC | PRN
  Administered 2014-01-17: 1 via ORAL
  Filled 2014-01-17: qty 1

## 2014-01-17 MED ORDER — TERBUTALINE SULFATE 1 MG/ML IJ SOLN
0.2500 mg | Freq: Once | INTRAMUSCULAR | Status: DC | PRN
  Filled 2014-01-17: qty 1

## 2014-01-17 MED ORDER — BISACODYL 10 MG RE SUPP: 10.0000 mg | Freq: Every day | RECTAL | Status: DC | PRN

## 2014-01-17 MED ORDER — LIDOCAINE HCL (PF) 1 % IJ SOLN: 30.0000 mL | INTRAMUSCULAR | Status: DC | PRN

## 2014-01-17 MED ORDER — LIDOCAINE HCL (PF) 1 % IJ SOLN
INTRAMUSCULAR | Status: DC | PRN
  Administered 2014-01-17 (×2): 5 mL

## 2014-01-17 MED ORDER — BENZOCAINE-MENTHOL 20-0.5 % EX AERO
1.0000 "application " | INHALATION_SPRAY | CUTANEOUS | Status: DC | PRN
  Administered 2014-01-18: 1 via TOPICAL
  Filled 2014-01-17: qty 56

## 2014-01-17 MED ORDER — FENTANYL 2.5 MCG/ML BUPIVACAINE 1/10 % EPIDURAL INFUSION (WH - ANES)
INTRAMUSCULAR | Status: AC
  Filled 2014-01-17: qty 125

## 2014-01-17 MED ORDER — SODIUM CHLORIDE 0.9 % IV SOLN
14.0000 mL/h | INTRAVENOUS | Status: DC | PRN
  Administered 2014-01-17: 14 mL/h via EPIDURAL
  Filled 2014-01-17 (×3): qty 17

## 2014-01-17 MED ORDER — LANOLIN HYDROUS EX OINT: TOPICAL_OINTMENT | CUTANEOUS | Status: DC | PRN

## 2014-01-17 MED ORDER — FENTANYL 2.5 MCG/ML BUPIVACAINE 1/10 % EPIDURAL INFUSION (WH - ANES)
14.0000 mL/h | INTRAMUSCULAR | Status: DC | PRN
Start: 2014-01-17 — End: 2014-01-17

## 2014-01-17 MED ORDER — FLEET ENEMA 7-19 GM/118ML RE ENEM: 1.0000 | ENEMA | Freq: Every day | RECTAL | Status: DC | PRN

## 2014-01-17 MED ORDER — EPHEDRINE 5 MG/ML INJ: 10.0000 mg | INTRAVENOUS | Status: DC | PRN

## 2014-01-17 MED ORDER — PHENYLEPHRINE 40 MCG/ML (10ML) SYRINGE FOR IV PUSH (FOR BLOOD PRESSURE SUPPORT): 80.0000 ug | PREFILLED_SYRINGE | INTRAVENOUS | Status: DC | PRN

## 2014-01-17 MED ORDER — SIMETHICONE 80 MG PO CHEW: 80.0000 mg | CHEWABLE_TABLET | ORAL | Status: DC | PRN

## 2014-01-17 MED ORDER — PHENYLEPHRINE 40 MCG/ML (10ML) SYRINGE FOR IV PUSH (FOR BLOOD PRESSURE SUPPORT)
80.0000 ug | PREFILLED_SYRINGE | INTRAVENOUS | Status: DC | PRN
  Filled 2014-01-17: qty 2

## 2014-01-17 MED ORDER — SENNOSIDES-DOCUSATE SODIUM 8.6-50 MG PO TABS
2.0000 | ORAL_TABLET | ORAL | Status: DC
  Administered 2014-01-17 – 2014-01-18 (×2): 2 via ORAL
  Filled 2014-01-17 (×2): qty 2

## 2014-01-17 MED ORDER — OXYCODONE-ACETAMINOPHEN 5-325 MG PO TABS: 1.0000 | ORAL_TABLET | ORAL | Status: DC | PRN

## 2014-01-17 MED ORDER — ONDANSETRON HCL 4 MG PO TABS: 4.0000 mg | ORAL_TABLET | ORAL | Status: DC | PRN

## 2014-01-17 MED ORDER — FLEET ENEMA 7-19 GM/118ML RE ENEM: 1.0000 | ENEMA | RECTAL | Status: DC | PRN

## 2014-01-17 MED ORDER — EPHEDRINE 5 MG/ML INJ
10.0000 mg | INTRAVENOUS | Status: DC | PRN
Start: 2014-01-17 — End: 2014-01-17
  Filled 2014-01-17: qty 2

## 2014-01-17 MED ORDER — OXYTOCIN 40 UNITS IN LACTATED RINGERS INFUSION - SIMPLE MED
1.0000 m[IU]/min | INTRAVENOUS | Status: DC
Start: 2014-01-17 — End: 2014-01-17
  Administered 2014-01-17: 333.333 m[IU]/min via INTRAVENOUS
  Administered 2014-01-17: 8 m[IU]/min via INTRAVENOUS
  Administered 2014-01-17: 2 m[IU]/min via INTRAVENOUS
  Administered 2014-01-17: 4 m[IU]/min via INTRAVENOUS
  Filled 2014-01-17: qty 1000

## 2014-01-17 MED ORDER — EPHEDRINE 5 MG/ML INJ
10.0000 mg | INTRAVENOUS | Status: DC | PRN
  Filled 2014-01-17: qty 2

## 2014-01-17 MED ORDER — PENICILLIN G POTASSIUM 5000000 UNITS IJ SOLR
2.5000 10*6.[IU] | INTRAMUSCULAR | Status: DC
  Administered 2014-01-17 (×2): 2.5 10*6.[IU] via INTRAVENOUS
  Filled 2014-01-17 (×5): qty 2.5

## 2014-01-17 MED ORDER — OXYTOCIN BOLUS FROM INFUSION: 500.0000 mL | INTRAVENOUS | Status: DC

## 2014-01-17 MED ORDER — TETANUS-DIPHTH-ACELL PERTUSSIS 5-2.5-18.5 LF-MCG/0.5 IM SUSP: 0.5000 mL | Freq: Once | INTRAMUSCULAR | Status: DC

## 2014-01-17 MED ORDER — ONDANSETRON HCL 4 MG/2ML IJ SOLN
4.0000 mg | Freq: Four times a day (QID) | INTRAMUSCULAR | Status: DC | PRN
  Administered 2014-01-17: 4 mg via INTRAVENOUS
  Filled 2014-01-17: qty 2

## 2014-01-17 MED ORDER — LIDOCAINE HCL (PF) 1 % IJ SOLN
30.0000 mL | INTRAMUSCULAR | Status: DC | PRN
  Filled 2014-01-17: qty 30

## 2014-01-17 MED ORDER — ACETAMINOPHEN 325 MG PO TABS: 650.0000 mg | ORAL_TABLET | ORAL | Status: DC | PRN

## 2014-01-17 MED ORDER — PHENYLEPHRINE 40 MCG/ML (10ML) SYRINGE FOR IV PUSH (FOR BLOOD PRESSURE SUPPORT)
PREFILLED_SYRINGE | INTRAVENOUS | Status: AC
  Filled 2014-01-17: qty 20

## 2014-01-17 MED ORDER — PENICILLIN G POTASSIUM 5000000 UNITS IJ SOLR
5.0000 10*6.[IU] | Freq: Once | INTRAVENOUS | Status: AC
  Administered 2014-01-17: 5 10*6.[IU] via INTRAVENOUS
  Filled 2014-01-17: qty 5

## 2014-01-17 MED ORDER — CITRIC ACID-SODIUM CITRATE 334-500 MG/5ML PO SOLN: 30.0000 mL | ORAL | Status: DC | PRN

## 2014-01-17 MED ORDER — LACTATED RINGERS IV SOLN
500.0000 mL | Freq: Once | INTRAVENOUS | Status: AC
  Administered 2014-01-17: 500 mL via INTRAVENOUS

## 2014-01-17 NOTE — Progress Notes (Signed)
Patient ID: [name], female   DOB: Apr 05, 1985, 29 y.o.   MRN: [id number] Desires btl.  Potential irreversability discussed.  Alternatives explained.  Failure rate of 1 in 200 explained. Can be in the form of an ectopic pregnacy requiring surgical management. Risk explained. Infection.  Hemorrhage that could require transfusions with risk of AID"s and hepatitis.  Injury to adjacent organ such as bowel that could require exploratory surgery. DVT and PE.  Patient expresses understanding of risks and alternatives

## 2014-01-17 NOTE — Anesthesia Preprocedure Evaluation (Addendum)
Anesthesia Evaluation  Patient identified by MRN, date of birth, ID band Patient awake    Reviewed: Allergy & Precautions, NPO status , Patient's Chart, lab work & pertinent test results  History of Anesthesia Complications Negative for: history of anesthetic complications  Airway Mallampati: II  TM Distance: >3 FB Neck ROM: Full    Dental no notable dental hx. (+) Dental Advisory Given   Pulmonary Current Smoker,  breath sounds clear to auscultation  Pulmonary exam normal       Cardiovascular negative cardio ROS  + Valvular Problems/Murmurs (murmur, normal echo 2010) Rhythm:Regular Rate:Normal     Neuro/Psych negative neurological ROS  negative psych ROS   GI/Hepatic negative GI ROS, Neg liver ROS,   Endo/Other  obesity  Renal/GU negative Renal ROS  negative genitourinary   Musculoskeletal negative musculoskeletal ROS (+)   Abdominal   Peds negative pediatric ROS (+)  Hematology negative hematology ROS (+)   Anesthesia Other Findings   Reproductive/Obstetrics (+) Pregnancy                            Anesthesia Physical Anesthesia Plan  ASA: II  Anesthesia Plan: Epidural   Post-op Pain Management:    Induction:   Airway Management Planned:   Additional Equipment:   Intra-op Plan:   Post-operative Plan:   Informed Consent: I have reviewed the patients History and Physical, chart, labs and discussed the procedure including the risks, benefits and alternatives for the proposed anesthesia with the patient or authorized representative who has indicated his/her understanding and acceptance.   Dental advisory given  Plan Discussed with: CRNA  Anesthesia Plan Comments: (Reports an allergy "hives" to the local anesthetic used in her epidural the last time but reports that she would like an epidural. Has had dental work done with any problems with local anesthetic. Will plan to  have a plain bag of bupivicaine without fentanyl for the patient)       Anesthesia Quick Evaluation

## 2014-01-17 NOTE — Progress Notes (Signed)
Lads had to be drawn before epidural caused delay in epidural.

## 2014-01-17 NOTE — Anesthesia Procedure Notes (Addendum)
Epidural Patient location during procedure: OB Start time: 01/17/2014 10:07 AM End time: 01/17/2014 10:15 AM  Staffing Anesthesiologist: Felipe Drone Performed by: anesthesiologist   Preanesthetic Checklist Completed: patient identified, site marked, surgical consent, pre-op evaluation, timeout performed, IV checked, risks and benefits discussed and monitors and equipment checked  Epidural Patient position: sitting Prep: site prepped and draped and DuraPrep Patient monitoring: continuous pulse ox and blood pressure Approach: midline Location: L3-L4 Injection technique: LOR saline  Needle:  Needle type: Tuohy  Needle gauge: 17 G Needle length: 9 cm and 9 Needle insertion depth: 4.5 cm Catheter type: closed end flexible Catheter size: 19 Gauge Catheter at skin depth: 8.5 cm Test dose: negative  Assessment Events: blood not aspirated, injection not painful, no injection resistance, negative IV test and no paresthesia  Additional Notes Patient identified. Risks/Benefits/Options discussed with patient including but not limited to bleeding, infection, nerve damage, paralysis, failed block, incomplete pain control, headache, blood pressure changes, nausea, vomiting, reactions to medication both or allergic, itching and postpartum back pain. Confirmed with bedside nurse the patient's most recent platelet count. Confirmed with patient that they are not currently taking any anticoagulation, have any bleeding history or any family history of bleeding disorders. Patient expressed understanding and wished to proceed. All questions were answered. Sterile technique was used throughout the entire procedure. Please see nursing notes for vital signs. Test dose was given through epidural catheter and negative prior to continuing to dose epidural or start infusion. Warning signs of high block given to the patient including shortness of breath, tingling/numbness in hands, complete motor block, or any  concerning symptoms with instructions to call for help. Patient was given instructions on fall risk and not to get out of bed. All questions and concerns addressed with instructions to call with any issues or inadequate analgesia.  Reason for block:procedure for pain  Epidural Patient location during procedure: OB Start time: 01/17/2014 3:00 PM End time: 01/17/2014 3:05 PM  Staffing Anesthesiologist: Karie Schwalbe JENNETTE Performed by: anesthesiologist   Preanesthetic Checklist Completed: patient identified, site marked, surgical consent, pre-op evaluation, timeout performed, IV checked, risks and benefits discussed and monitors and equipment checked  Epidural Patient position: sitting Prep: site prepped and draped and DuraPrep Patient monitoring: continuous pulse ox and blood pressure Approach: midline Location: L3-L4 Injection technique: LOR saline  Needle:  Needle type: Tuohy  Needle gauge: 17 G Needle length: 9 cm and 9 Needle insertion depth: 5 cm cm Catheter type: closed end flexible Catheter size: 19 Gauge Catheter at skin depth: 10 cm Test dose: negative  Assessment Events: blood not aspirated, injection not painful, no injection resistance, negative IV test and no paresthesia  Additional Notes Patient identified. Risks/Benefits/Options discussed with patient including but not limited to bleeding, infection, nerve damage, paralysis, failed block, incomplete pain control, headache, blood pressure changes, nausea, vomiting, reactions to medication both or allergic, itching and postpartum back pain. Confirmed with bedside nurse the patient's most recent platelet count. Confirmed with patient that they are not currently taking any anticoagulation, have any bleeding history or any family history of bleeding disorders. Patient expressed understanding and wished to proceed. All questions were answered. Sterile technique was used throughout the entire procedure. Please see nursing notes  for vital signs. Test dose was given through epidural catheter and negative prior to continuing to dose epidural or start infusion. Warning signs of high block given to the patient including shortness of breath, tingling/numbness in hands, complete motor block, or any concerning symptoms with  instructions to call for help. Patient was given instructions on fall risk and not to get out of bed. All questions and concerns addressed with instructions to call with any issues or inadequate analgesia.   Reason for block:procedure for pain

## 2014-01-17 NOTE — H&P (Signed)
[name] is a 29 y.o. female presenting at 5239 weeks for induction.  Came in with contractions. GBS positive.  Tobacco use. Maternal Medical History:  Contractions: Onset was 3-5 hours ago.   Frequency: irregular.   Perceived severity is moderate.    Fetal activity: Perceived fetal activity is normal.    Prenatal complications: no prenatal complications Prenatal Complications - Diabetes: none.    OB History    Gravida Para Term Preterm AB TAB SAB Ectopic Multiple Living   4 3 3       3      Past Medical History  Diagnosis Date  . No pertinent past medical history   . Heart murmur     cardiac eval 2010   Past Surgical History  Procedure Laterality Date  . Gallbladder surgery    . Wisdom tooth extraction    . Cholecystectomy     Family History: family history includes Cancer in her paternal aunt; Diabetes in her paternal grandfather; Heart attack in her father. Social History:  reports that she has been smoking.  She does not have any smokeless tobacco history on file. She reports that she does not drink alcohol or use illicit drugs.   Prenatal Transfer Tool  Maternal Diabetes: No Genetic Screening: Declined Maternal Ultrasounds/Referrals: Normal Fetal Ultrasounds or other Referrals:  None Maternal Substance Abuse:  No Significant Maternal Medications:  None Significant Maternal Lab Results:  Lab values include: Group B Strep positive Other Comments:  None  ROS  Dilation: 3 Effacement (%): 70 Station: -3 Exam by:: DrMcComb Blood pressure 133/85, pulse 101, temperature 98 F (36.7 C), temperature source Oral, resp. rate 18, height 5\' 2"  (1.575 m), weight 204 lb (92.534 kg). Maternal Exam:  Uterine Assessment: Contraction strength is mild.  Contraction frequency is irregular.   Abdomen: Patient reports no abdominal tenderness. Estimated fetal weight is 7-8.   Fetal presentation: vertex  Pelvis: adequate for delivery.   Cervix: 3 cm 50-%  Post vtx  Fetal  Exam Fetal State Assessment: Category I - tracings are normal.     Physical Exam  Prenatal labs: ABO, Rh: --/--/A NEG (01/04 0335) Antibody: NEG (01/04 0335) Rubella: Immune (06/18 0000) RPR: NON REAC (01/04 0335)  HBsAg: Negative (06/18 0000)  HIV: Non-reactive (06/18 0000)  GBS: Positive (01/03 0000)   Assessment/Plan:IUP at 39 weeks for induction Positvie GBS. Begin pitocin discussed risk.  IV PEN G.   Katalyna Socarras S 01/17/2014, 7:43 AM

## 2014-01-18 ENCOUNTER — Encounter (HOSPITAL_COMMUNITY)
Admission: RE | Disposition: A | Discharge status: Home / Self Care | Payer: Self-pay | Source: Ambulatory Visit | Attending: Obstetrics and Gynecology

## 2014-01-18 LAB — CBC
HCT: 25.3 % — ABNORMAL LOW (ref 36.0–46.0)
HEMOGLOBIN: 8.7 g/dL — AB (ref 12.0–15.0)
MCH: 30.5 pg (ref 26.0–34.0)
MCHC: 34.4 g/dL (ref 30.0–36.0)
MCV: 88.8 fL (ref 78.0–100.0)
PLATELETS: 216 10*3/uL (ref 150–400)
RBC: 2.85 MIL/uL — ABNORMAL LOW (ref 3.87–5.11)
RDW: 13.3 % (ref 11.5–15.5)
WBC: 11.1 10*3/uL — AB (ref 4.0–10.5)

## 2014-01-18 LAB — RPR: RPR Ser Ql: NONREACTIVE — AB

## 2014-01-18 SURGERY — LIGATION, FALLOPIAN TUBE, POSTPARTUM
Anesthesia: Epidural | Laterality: Bilateral

## 2014-01-18 MED ORDER — RHO D IMMUNE GLOBULIN 1500 UNIT/2ML IJ SOSY
300.0000 ug | PREFILLED_SYRINGE | Freq: Once | INTRAMUSCULAR | Status: AC
  Administered 2014-01-18: 300 ug via INTRAVENOUS
  Filled 2014-01-18: qty 2

## 2014-01-18 MED ORDER — PNEUMOCOCCAL VAC POLYVALENT 25 MCG/0.5ML IJ INJ: 0.5000 mL | INJECTION | INTRAMUSCULAR | Status: DC

## 2014-01-18 MED ORDER — PNEUMOCOCCAL VAC POLYVALENT 25 MCG/0.5ML IJ INJ
0.5000 mL | INJECTION | Freq: Once | INTRAMUSCULAR | Status: AC
  Administered 2014-01-18: 0.5 mL via INTRAMUSCULAR
  Filled 2014-01-18: qty 0.5

## 2014-01-18 NOTE — Progress Notes (Signed)
Post Partum Day one Subjective: no complaints  Objective: Blood pressure 121/55, pulse 69, temperature 98.1 F (36.7 C), temperature source Oral, resp. rate 18, height 5\' 2"  (1.575 m), weight 204 lb (92.534 kg), SpO2 99 %, unknown if currently breastfeeding.  Physical Exam:  General: alert Lochia: appropriate Uterine Fundus: firm Incision: healing well DVT Evaluation: No evidence of DVT seen on physical exam.   Recent Labs  01/17/14 0930 01/18/14 0623  HGB 9.9* 8.7*  HCT 29.3* 25.3*    Assessment/Plan: Plan for discharge tomorrow   LOS: 1 day   [name] S 01/18/2014, 9:23 AM

## 2014-01-18 NOTE — Anesthesia Postprocedure Evaluation (Signed)
Anesthesia Post Note  Patient: [name]  Procedure(s) Performed: * No procedures listed *  Anesthesia type: Epidural  Patient location: Mother/Baby  Post pain: Pain level controlled  Post assessment: Post-op Vital signs reviewed  Last Vitals:  Filed Vitals:   01/18/14 0557  BP: 121/55  Pulse: 69  Temp: 36.7 C  Resp: 18    Post vital signs: Reviewed  Level of consciousness: awake  Complications: No apparent anesthesia complications

## 2014-01-18 NOTE — Lactation Note (Signed)
This note was copied from the chart of Christina Riley Killatricia Yokoyama. Lactation Consultation Note: Initial visit with mom. She reports that baby is nursing well but she had to give some formula because he was nursing so much. Encouraged to always BF first to promote a good milk supply. BF brochure given with resources for support after DC. No questions at present. To call prn  Patient Name: [name] ZOXWR'UToday's Date: 01/18/2014 Reason for consult: Initial assessment   Maternal Data Formula Feeding for Exclusion: Yes Reason for exclusion: Mother's choice to formula and breast feed on admission Does the patient have breastfeeding experience prior to this delivery?: Yes  Feeding   LATCH Score/Interventions                      Lactation Tools Discussed/Used     Consult Status Consult Status: Complete    Pamelia HoitWeeks, Maleny Candy D 01/18/2014, 2:55 PM

## 2014-01-19 LAB — RH IG WORKUP (INCLUDES ABO/RH)
ABO/RH(D): A NEG
Fetal Screen: NEGATIVE
GESTATIONAL AGE(WKS): 39.2
Unit division: 0

## 2014-01-19 NOTE — Discharge Summary (Signed)
Obstetric Discharge Summary Reason for Admission: induction of labor Prenatal Procedures: none Intrapartum Procedures: spontaneous vaginal delivery Postpartum Procedures: none Complications-Operative and Postpartum: first degree perineal laceration HEMOGLOBIN  Date Value Ref Range Status  01/18/2014 8.7* 12.0 - 15.0 g/dL Final   HCT  Date Value Ref Range Status  01/18/2014 25.3* 36.0 - 46.0 % Final    Physical Exam:  General: alert Lochia: appropriate Uterine Fundus: firm Incision: na  DVT Evaluation: No evidence of DVT seen on physical exam.  Discharge Diagnoses: Term Pregnancy-delivered  Discharge Information: Date: 01/19/2014 Activity: pelvic rest Diet: routine Medications: PNV and Ibuprofen Condition: stable Instructions: refer to practice specific booklet Discharge to: home   Newborn Data: Live born female  Birth Weight: 8 lb 10 oz (3912 g) APGAR: 9, 9  Home with mother.  [name] 01/19/2014, 8:08 AM

## 2014-01-20 NOTE — Progress Notes (Signed)
Ur chart review completed.  

## 2015-04-12 NOTE — H&P (Signed)
[name] is an 30 y.o. female who desires permanent sterility.  Pertinent Gynecological History: Menses: flow is moderate Bleeding: regular Contraception: condoms DES exposure: unknown Blood transfusions: none Sexually transmitted diseases: no past history Previous GYN Procedures: none  Last mammogram: n/a Date: n/a Last pap: normal Date: 03/2015 OB History: G4, P4   Menstrual History: Menarche age: n/a No LMP recorded.    Past Medical History  Diagnosis Date  . No pertinent past medical history   . Heart murmur     cardiac eval 2010    Past Surgical History  Procedure Laterality Date  . Gallbladder surgery    . Wisdom tooth extraction    . Cholecystectomy      Family History  Problem Relation Age of Onset  . Heart attack Father   . Cancer Paternal Aunt     breast  . Diabetes Paternal Grandfather     Social History:  reports that she has been smoking.  She does not have any smokeless tobacco history on file. She reports that she does not drink alcohol or use illicit drugs.  Allergies:  Allergies  Allergen Reactions  . Bupivacaine Hives    Patient states she's allergic to epidurals. Rxn is tolerable per pt report, requesting epidural per RN request  . Lidocaine Viscous Hives    Pt's rxn is tolerable per pt/RN report  . Other Hives    GI cocktail  . Pb-Hyoscy-Atropine-Scopolamine Hives          No prescriptions prior to admission    ROS  unknown if currently breastfeeding. Physical Exam  Constitutional: She is oriented to person, place, and time. She appears well-developed and well-nourished.  Cardiovascular: Normal rate and regular rhythm.   Respiratory: Effort normal and breath sounds normal.  GI: Soft. There is no rebound and no guarding.  Neurological: She is alert and oriented to person, place, and time.  Skin: Skin is warm and dry.  Psychiatric: She has a normal mood and affect. Her behavior is normal.    No results found for this or  any previous visit (from the past 24 hour(s)).  No results found.  Assessment/Plan: 30yo G4P4 desirous of permanent sterility -L/s BTL-Patient is counseled re: risk of bleeding, infection, scarring, and damage to surrounding structures.  She understands the risk of permanence, regret and ectopic.  All questions were answered and the patient wishes to proceed.  [name] 04/12/2015, 7:15 PM

## 2015-04-14 ENCOUNTER — Encounter (HOSPITAL_BASED_OUTPATIENT_CLINIC_OR_DEPARTMENT_OTHER): Payer: Self-pay | Admitting: *Deleted

## 2015-04-14 NOTE — Progress Notes (Signed)
Pt instructed npo pmn 4/5.  [facility]  Needs hgb, urine hcg on arrival

## 2015-04-16 ENCOUNTER — Ambulatory Visit (HOSPITAL_BASED_OUTPATIENT_CLINIC_OR_DEPARTMENT_OTHER): Payer: BLUE CROSS/BLUE SHIELD | Admitting: Anesthesiology

## 2015-04-16 ENCOUNTER — Encounter (HOSPITAL_BASED_OUTPATIENT_CLINIC_OR_DEPARTMENT_OTHER): Payer: Self-pay | Admitting: *Deleted

## 2015-04-16 ENCOUNTER — Encounter (HOSPITAL_BASED_OUTPATIENT_CLINIC_OR_DEPARTMENT_OTHER)
Admission: RE | Disposition: A | Discharge status: Home / Self Care | Payer: Self-pay | Source: Ambulatory Visit | Attending: Obstetrics & Gynecology

## 2015-04-16 ENCOUNTER — Ambulatory Visit (HOSPITAL_BASED_OUTPATIENT_CLINIC_OR_DEPARTMENT_OTHER)
Admission: RE | Admit: 2015-04-16 | Discharge: 2015-04-16 | Disposition: A | Discharge status: Home / Self Care | Payer: BLUE CROSS/BLUE SHIELD | Source: Ambulatory Visit | Attending: Obstetrics & Gynecology | Admitting: Obstetrics & Gynecology

## 2015-04-16 DIAGNOSIS — Z8249 Family history of ischemic heart disease and other diseases of the circulatory system: Secondary | ICD-10-CM | POA: Insufficient documentation

## 2015-04-16 DIAGNOSIS — N949 Unspecified condition associated with female genital organs and menstrual cycle: Secondary | ICD-10-CM

## 2015-04-16 DIAGNOSIS — F17210 Nicotine dependence, cigarettes, uncomplicated: Secondary | ICD-10-CM | POA: Insufficient documentation

## 2015-04-16 DIAGNOSIS — Z302 Encounter for sterilization: Secondary | ICD-10-CM | POA: Diagnosis not present

## 2015-04-16 HISTORY — PX: LAPAROSCOPIC TUBAL LIGATION: SHX1937

## 2015-04-16 HISTORY — DX: Family history of other specified conditions: Z84.89

## 2015-04-16 HISTORY — DX: Presence of spectacles and contact lenses: Z97.3

## 2015-04-16 LAB — POCT PREGNANCY, URINE: Preg Test, Ur: NEGATIVE

## 2015-04-16 LAB — POCT I-STAT 4, (NA,K, GLUC, HGB,HCT)
Glucose, Bld: 112 mg/dL — ABNORMAL HIGH (ref 65–99)
HEMATOCRIT: 39 % (ref 36.0–46.0)
HEMOGLOBIN: 13.3 g/dL (ref 12.0–15.0)
Potassium: 3.9 mmol/L (ref 3.5–5.1)
SODIUM: 139 mmol/L (ref 135–145)

## 2015-04-16 SURGERY — LIGATION, FALLOPIAN TUBE, LAPAROSCOPIC
Anesthesia: General | Site: Abdomen | Laterality: Bilateral

## 2015-04-16 MED ORDER — FENTANYL CITRATE (PF) 100 MCG/2ML IJ SOLN
INTRAMUSCULAR | Status: AC
  Filled 2015-04-16: qty 2

## 2015-04-16 MED ORDER — OXYCODONE HCL 5 MG PO TABS
ORAL_TABLET | ORAL | Status: AC
  Filled 2015-04-16: qty 1

## 2015-04-16 MED ORDER — METOCLOPRAMIDE HCL 5 MG/ML IJ SOLN
INTRAMUSCULAR | Status: AC
  Filled 2015-04-16: qty 2

## 2015-04-16 MED ORDER — ROCURONIUM BROMIDE 100 MG/10ML IV SOLN
INTRAVENOUS | Status: AC
  Filled 2015-04-16: qty 1

## 2015-04-16 MED ORDER — ONDANSETRON HCL 4 MG/2ML IJ SOLN
4.0000 mg | Freq: Four times a day (QID) | INTRAMUSCULAR | Status: DC | PRN
  Filled 2015-04-16: qty 2

## 2015-04-16 MED ORDER — OXYCODONE-ACETAMINOPHEN 5-325 MG PO TABS: 1.0000 | ORAL_TABLET | ORAL | Status: AC | PRN

## 2015-04-16 MED ORDER — OXYCODONE HCL 5 MG PO TABS
5.0000 mg | ORAL_TABLET | Freq: Once | ORAL | Status: AC | PRN
  Administered 2015-04-16: 5 mg via ORAL
  Filled 2015-04-16: qty 1

## 2015-04-16 MED ORDER — ROCURONIUM BROMIDE 100 MG/10ML IV SOLN
INTRAVENOUS | Status: DC | PRN
  Administered 2015-04-16: 30 mg via INTRAVENOUS

## 2015-04-16 MED ORDER — FENTANYL CITRATE (PF) 100 MCG/2ML IJ SOLN
25.0000 ug | INTRAMUSCULAR | Status: DC | PRN
  Filled 2015-04-16: qty 1

## 2015-04-16 MED ORDER — OXYCODONE HCL 5 MG/5ML PO SOLN
5.0000 mg | Freq: Once | ORAL | Status: AC | PRN
  Filled 2015-04-16: qty 5

## 2015-04-16 MED ORDER — PROPOFOL 10 MG/ML IV BOLUS
INTRAVENOUS | Status: DC | PRN
  Administered 2015-04-16: 200 mg via INTRAVENOUS

## 2015-04-16 MED ORDER — SUGAMMADEX SODIUM 200 MG/2ML IV SOLN
INTRAVENOUS | Status: AC
  Filled 2015-04-16: qty 2

## 2015-04-16 MED ORDER — MIDAZOLAM HCL 5 MG/5ML IJ SOLN
INTRAMUSCULAR | Status: DC | PRN
  Administered 2015-04-16: 2 mg via INTRAVENOUS

## 2015-04-16 MED ORDER — SUGAMMADEX SODIUM 200 MG/2ML IV SOLN
INTRAVENOUS | Status: DC | PRN
  Administered 2015-04-16: 200 mg via INTRAVENOUS

## 2015-04-16 MED ORDER — MIDAZOLAM HCL 2 MG/2ML IJ SOLN
INTRAMUSCULAR | Status: AC
  Filled 2015-04-16: qty 2

## 2015-04-16 MED ORDER — IBUPROFEN 800 MG PO TABS: 800.0000 mg | ORAL_TABLET | Freq: Four times a day (QID) | ORAL | Status: DC | PRN

## 2015-04-16 MED ORDER — PROPOFOL 10 MG/ML IV BOLUS
INTRAVENOUS | Status: AC
  Filled 2015-04-16: qty 40

## 2015-04-16 MED ORDER — DEXAMETHASONE SODIUM PHOSPHATE 4 MG/ML IJ SOLN
INTRAMUSCULAR | Status: DC | PRN
  Administered 2015-04-16: 10 mg via INTRAVENOUS

## 2015-04-16 MED ORDER — FENTANYL CITRATE (PF) 100 MCG/2ML IJ SOLN
INTRAMUSCULAR | Status: DC | PRN
  Administered 2015-04-16 (×3): 50 ug via INTRAVENOUS

## 2015-04-16 MED ORDER — METOCLOPRAMIDE HCL 5 MG/ML IJ SOLN
INTRAMUSCULAR | Status: DC | PRN
  Administered 2015-04-16: 5 mg via INTRAVENOUS

## 2015-04-16 MED ORDER — KETOROLAC TROMETHAMINE 30 MG/ML IJ SOLN
INTRAMUSCULAR | Status: DC | PRN
  Administered 2015-04-16: 30 mg via INTRAVENOUS

## 2015-04-16 MED ORDER — ONDANSETRON HCL 4 MG/2ML IJ SOLN
INTRAMUSCULAR | Status: AC
  Filled 2015-04-16: qty 2

## 2015-04-16 MED ORDER — LACTATED RINGERS IV SOLN
INTRAVENOUS | Status: DC
  Filled 2015-04-16: qty 1000

## 2015-04-16 MED ORDER — ONDANSETRON HCL 4 MG/2ML IJ SOLN
INTRAMUSCULAR | Status: DC | PRN
Start: 2015-04-16 — End: 2015-04-16
  Administered 2015-04-16: 4 mg via INTRAVENOUS

## 2015-04-16 MED ORDER — DEXAMETHASONE SODIUM PHOSPHATE 10 MG/ML IJ SOLN
INTRAMUSCULAR | Status: AC
  Filled 2015-04-16: qty 1

## 2015-04-16 MED ORDER — LACTATED RINGERS IV SOLN
INTRAVENOUS | Status: DC
  Administered 2015-04-16 (×2): via INTRAVENOUS
  Filled 2015-04-16: qty 1000

## 2015-04-16 SURGICAL SUPPLY — 64 items
APL SKNCLS STERI-STRIP NONHPOA (GAUZE/BANDAGES/DRESSINGS)
APPLICATOR COTTON TIP 6IN STRL (MISCELLANEOUS) ×3 IMPLANT
BAG SPEC RTRVL LRG 6X4 10 (ENDOMECHANICALS)
BANDAGE CO FLEX L/F 1IN X 5YD (GAUZE/BANDAGES/DRESSINGS) ×2 IMPLANT
BENZOIN TINCTURE PRP APPL 2/3 (GAUZE/BANDAGES/DRESSINGS) IMPLANT
BLADE SURG 11 STRL SS (BLADE) ×3 IMPLANT
BLADE SURG 15 STRL LF DISP TIS (BLADE) IMPLANT
BLADE SURG 15 STRL SS (BLADE)
CANISTER SUCTION 2500CC (MISCELLANEOUS) IMPLANT
CATH ROBINSON RED A/P 16FR (CATHETERS) ×3 IMPLANT
CHLORAPREP W/TINT 26ML (MISCELLANEOUS) ×3 IMPLANT
CLIP FILSHIE TUBAL LIGA STRL (Clip) ×2 IMPLANT
CLOSURE WOUND 1/2 X4 (GAUZE/BANDAGES/DRESSINGS)
CLOSURE WOUND 1/4X4 (GAUZE/BANDAGES/DRESSINGS)
COVER MAYO STAND STRL (DRAPES) ×3 IMPLANT
DRAPE UNDERBUTTOCKS STRL (DRAPE) ×3 IMPLANT
DRSG TEGADERM 4X4.75 (GAUZE/BANDAGES/DRESSINGS) ×3 IMPLANT
ELECT REM PT RETURN 9FT ADLT (ELECTROSURGICAL) ×3
ELECTRODE REM PT RTRN 9FT ADLT (ELECTROSURGICAL) ×1 IMPLANT
GLOVE BIO SURGEON STRL SZ 6 (GLOVE) ×3 IMPLANT
GLOVE BIO SURGEON STRL SZ 6.5 (GLOVE) ×1 IMPLANT
GLOVE BIO SURGEONS STRL SZ 6.5 (GLOVE) ×1
GLOVE BIOGEL PI IND STRL 6 (GLOVE) ×2 IMPLANT
GLOVE BIOGEL PI IND STRL 6.5 (GLOVE) IMPLANT
GLOVE BIOGEL PI IND STRL 7.5 (GLOVE) IMPLANT
GLOVE BIOGEL PI INDICATOR 6 (GLOVE) ×4
GLOVE BIOGEL PI INDICATOR 6.5 (GLOVE) ×2
GLOVE BIOGEL PI INDICATOR 7.5 (GLOVE) ×2
GOWN STRL REUS W/ TWL LRG LVL3 (GOWN DISPOSABLE) ×3 IMPLANT
GOWN STRL REUS W/TWL LRG LVL3 (GOWN DISPOSABLE) ×9
KIT ROOM TURNOVER WOR (KITS) ×3 IMPLANT
LIQUID BAND (GAUZE/BANDAGES/DRESSINGS) IMPLANT
MANIFOLD NEPTUNE II (INSTRUMENTS) IMPLANT
NDL HYPO 25X1 1.5 SAFETY (NEEDLE) ×1 IMPLANT
NEEDLE HYPO 25X1 1.5 SAFETY (NEEDLE) ×3 IMPLANT
NEEDLE INSUFFLATION 120MM (ENDOMECHANICALS) ×3 IMPLANT
NS IRRIG 500ML POUR BTL (IV SOLUTION) ×3 IMPLANT
PACK BASIN DAY SURGERY FS (CUSTOM PROCEDURE TRAY) ×3 IMPLANT
PAD OB MATERNITY 4.3X12.25 (PERSONAL CARE ITEMS) ×3 IMPLANT
PADDING ION DISPOSABLE (MISCELLANEOUS) ×3 IMPLANT
PENCIL BUTTON HOLSTER BLD 10FT (ELECTRODE) IMPLANT
POUCH SPECIMEN RETRIEVAL 10MM (ENDOMECHANICALS) IMPLANT
SCISSORS LAP 5X35 DISP (ENDOMECHANICALS) IMPLANT
SEALER TISSUE G2 CVD JAW 35 (ENDOMECHANICALS) IMPLANT
SEALER TISSUE G2 CVD JAW 45CM (ENDOMECHANICALS) IMPLANT
SET IRRIG TUBING LAPAROSCOPIC (IRRIGATION / IRRIGATOR) IMPLANT
SOLUTION ANTI FOG 6CC (MISCELLANEOUS) ×3 IMPLANT
SPONGE GAUZE 2X2 8PLY STER LF (GAUZE/BANDAGES/DRESSINGS)
SPONGE GAUZE 2X2 8PLY STRL LF (GAUZE/BANDAGES/DRESSINGS) IMPLANT
SPONGE LAP 4X18 X RAY DECT (DISPOSABLE) IMPLANT
STRIP CLOSURE SKIN 1/2X4 (GAUZE/BANDAGES/DRESSINGS) IMPLANT
STRIP CLOSURE SKIN 1/4X4 (GAUZE/BANDAGES/DRESSINGS) IMPLANT
SUT MNCRL AB 3-0 PS2 18 (SUTURE) ×3 IMPLANT
SUT VICRYL 0 UR6 27IN ABS (SUTURE) ×3 IMPLANT
SYR CONTROL 10ML LL (SYRINGE) ×3 IMPLANT
TOWEL OR 17X24 6PK STRL BLUE (TOWEL DISPOSABLE) ×6 IMPLANT
TRAY DSU PREP LF (CUSTOM PROCEDURE TRAY) ×3 IMPLANT
TROCAR XCEL NON-BLD 11X100MML (ENDOMECHANICALS) ×3 IMPLANT
TROCAR XCEL NON-BLD 5MMX100MML (ENDOMECHANICALS) IMPLANT
TUBE CONNECTING 12'X1/4 (SUCTIONS)
TUBE CONNECTING 12X1/4 (SUCTIONS) IMPLANT
TUBING INSUFFLATION 10FT LAP (TUBING) ×3 IMPLANT
WARMER LAPAROSCOPE (MISCELLANEOUS) ×3 IMPLANT
WATER STERILE IRR 500ML POUR (IV SOLUTION) ×3 IMPLANT

## 2015-04-16 NOTE — Progress Notes (Signed)
No change to H&P.  Christina Labo, dO 

## 2015-04-16 NOTE — Discharge Instructions (Addendum)
Call MD for T>100.4, severe abdominal pain, or respiratory distress.  Call office to schedule postop appointment in 2 weeks.  No driving while taking narcotics.   Post Anesthesia Home Care Instructions  Activity: Get plenty of rest for the remainder of the day. A responsible adult should stay with you for 24 hours following the procedure.  For the next 24 hours, DO NOT: -Drive a car -Advertising copywriter -Drink alcoholic beverages -Take any medication unless instructed by your physician -Make any legal decisions or sign important papers.  Meals: Start with liquid foods such as gelatin or soup. Progress to regular foods as tolerated. Avoid greasy, spicy, heavy foods. If nausea and/or vomiting occur, drink only clear liquids until the nausea and/or vomiting subsides. Call your physician if vomiting continues.  Special Instructions/Symptoms: Your throat may feel dry or sore from the anesthesia or the breathing tube placed in your throat during surgery. If this causes discomfort, gargle with warm salt water. The discomfort should disappear within 24 hours.  If you had a scopolamine patch placed behind your ear for the management of post- operative nausea and/or vomiting:  1. The medication in the patch is effective for 72 hours, after which it should be removed.  Wrap patch in a tissue and discard in the trash. Wash hands thoroughly with soap and water. 2. You may remove the patch earlier than 72 hours if you experience unpleasant side effects which may include dry mouth, dizziness or visual disturbances. 3. Avoid touching the patch. Wash your hands with soap and water after contact with the patch.   Laparoscopic Tubal Ligation Laparoscopic tubal ligation is a procedure that closes the fallopian tubes at a time other than right after childbirth. When the fallopian tubes are closed, the eggs that are released from the ovaries cannot enter the uterus, and sperm cannot reach the egg. Tubal ligation is  also known as getting your "tubes tied." Tubal ligation is done so you will not be able to get pregnant or have a baby. Although this procedure may be undone (reversed), it should be considered permanent and irreversible. If you want to have future pregnancies, you should not have this procedure. LET [facility] CARE PROVIDER KNOW ABOUT:  Any allergies you have.  All medicines you are taking, including vitamins, herbs, eye drops, creams, and over-the-counter medicines. This includes any use of steroids, either by mouth or in cream form.  Previous problems you or members of your family have had with the use of anesthetics.  Any blood disorders you have.  Previous surgeries you have had.  Any medical conditions you may have.  Possibility of pregnancy, if this applies.  Any past pregnancies. RISKS AND COMPLICATIONS  Infection.  Bleeding.  Injury to surrounding organs.  Side effects from anesthetics.  Failure of the procedure.  Ectopic pregnancy.  Future regret about having the procedure done. BEFORE THE PROCEDURE  Ask your health care provider about:  Changing or stopping your regular medicines. This is especially important if you are taking diabetes medicines or blood thinners.  Taking medicines such as aspirin and ibuprofen. These medicines can thin your blood. Do not take these medicines before your procedure if your health care provider instructs you not to.  Follow instructions from your health care provider about eating and drinking restrictions.  Plan to have someone take you home after the procedure.  If you go home right after the procedure, plan to have someone with you for 24 hours. PROCEDURE  You will be  given one or more of the following:  A medicine that helps you relax (sedative).  A medicine that numbs the area (local anesthetic).  A medicine that makes you fall asleep (general anesthetic).  A medicine that is injected into an area of your body  that numbs everything below the injection site (regional anesthetic).  If you have been given general anesthetic, a tube will be put down your throat to help you breathe.  Two small cuts (incisions) will be made in the lower abdominal area and near the belly button.  Your bladder may be emptied with a small tube (catheter).  Your abdomen will be inflated with a safe gas (carbon dioxide). This will help to give the surgeon room to operate and visualize, and it will help the surgeon to avoid other organs.  A thin, lighted tube (laparoscope) with a camera attached will be inserted into your abdomen through one of the incisions near the belly button. Other small instruments will be inserted through the other abdominal incision.  The fallopian tubes will be tied off or burned (cauterized), or they will be blocked with a clip, ring, or clamp. In many cases, a small portion in the center of each fallopian tube will also be removed.  After the fallopian tubes are blocked, the gas will be released from the abdomen.  The incisions will be closed with stitches (sutures).  A bandage (dressing) will be placed over the incisions. The procedure may vary among health care providers and hospitals. AFTER THE PROCEDURE  Your blood pressure, heart rate, breathing rate, and blood oxygen level will be monitored often until the medicines you were given have worn off.  You will be given pain medicine as needed.  If you had general anesthetic, you may have some mild discomfort in your throat. This is from the breathing tube that was placed in your throat while you were sleeping.  You may experience discomfort in the shoulder area from some trapped air between your liver and your diaphragm. This sensation is normal, and it will slowly go away on its own.  You will have some mild abdominal discomfort for 3--7 days.   This information is not intended to replace advice given to you by your health care provider.  Make sure you discuss any questions you have with your health care provider.   Document Released: 04/04/2000 Document Revised: 05/13/2014 Document Reviewed: 04/09/2011 Elsevier Interactive Patient Education [employer]2016 [employer]

## 2015-04-16 NOTE — Anesthesia Preprocedure Evaluation (Signed)
Anesthesia Evaluation  Patient identified by MRN, date of birth, ID band Patient awake    Reviewed: Allergy & Precautions, NPO status , Patient's Chart, lab work & pertinent test results  Airway Mallampati: II   Neck ROM: full    Dental   Pulmonary Current Smoker,    breath sounds clear to auscultation       Cardiovascular negative cardio ROS   Rhythm:regular Rate:Normal     Neuro/Psych    GI/Hepatic   Endo/Other  obese  Renal/GU      Musculoskeletal   Abdominal   Peds  Hematology   Anesthesia Other Findings   Reproductive/Obstetrics                             Anesthesia Physical Anesthesia Plan  ASA: II  Anesthesia Plan: General   Post-op Pain Management:    Induction: Intravenous  Airway Management Planned: Oral ETT  Additional Equipment:   Intra-op Plan:   Post-operative Plan: Extubation in OR  Informed Consent: I have reviewed the patients History and Physical, chart, labs and discussed the procedure including the risks, benefits and alternatives for the proposed anesthesia with the patient or authorized representative who has indicated his/her understanding and acceptance.     Plan Discussed with: CRNA, Anesthesiologist and Surgeon  Anesthesia Plan Comments:         Anesthesia Quick Evaluation  

## 2015-04-16 NOTE — Transfer of Care (Signed)
Last Vitals:  Filed Vitals:   04/16/15 0608 04/16/15 0830  BP: 154/73 150/98  Pulse: 101 96  Temp: 36.8 C 37 C  Resp: 16 18   Immediate Anesthesia Transfer of Care Note  Patient: Christina PorteousPatricia L Hendrix  Procedure(s) Performed: Procedure(s) (LRB): LAPAROSCOPIC TUBAL LIGATION WITH FILSHIE CLIPS (Bilateral)  Patient Location: PACU  Anesthesia Type: General  Level of Consciousness: awake, alert  and oriented  Airway & Oxygen Therapy: Patient Spontanous Breathing and Patient connected to nasal cannula oxygen  Post-op Assessment: Report given to PACU RN and Post -op Vital signs reviewed and stable  Post vital signs: Reviewed and stable  Complications: No apparent anesthesia complications

## 2015-04-16 NOTE — Anesthesia Postprocedure Evaluation (Signed)
Anesthesia Post Note  Patient: [name]  Procedure(s) Performed: Procedure(s) (LRB): LAPAROSCOPIC TUBAL LIGATION WITH FILSHIE CLIPS (Bilateral)  Patient location during evaluation: PACU Anesthesia Type: General Level of consciousness: awake and alert and patient cooperative Pain management: pain level controlled Vital Signs Assessment: post-procedure vital signs reviewed and stable Respiratory status: spontaneous breathing and respiratory function stable Cardiovascular status: stable Anesthetic complications: no    Last Vitals:  Filed Vitals:   04/16/15 0845 04/16/15 0900  BP: 146/92 140/90  Pulse: 93 78  Temp:    Resp: 21 16    Last Pain:  Filed Vitals:   04/16/15 0915  PainSc: 0-No pain                 Maren Wiesen S

## 2015-04-16 NOTE — Anesthesia Procedure Notes (Signed)
Procedure Name: Intubation Date/Time: 04/16/2015 7:36 AM Performed by: Norva PavlovALLAWAY, Khiem Gargis G Pre-anesthesia Checklist: Patient identified, Emergency Drugs available, Suction available and Patient being monitored Patient Re-evaluated:Patient Re-evaluated prior to inductionOxygen Delivery Method: Circle System Utilized Preoxygenation: Pre-oxygenation with 100% oxygen Intubation Type: IV induction Ventilation: Mask ventilation without difficulty Laryngoscope Size: Mac and 3 Grade View: Grade I Tube type: Oral Tube size: 7.0 mm Number of attempts: 1 Airway Equipment and Method: Stylet Placement Confirmation: ETT inserted through vocal cords under direct vision,  positive ETCO2 and breath sounds checked- equal and bilateral Secured at: 20 cm Tube secured with: Tape Dental Injury: Teeth and Oropharynx as per pre-operative assessment

## 2015-04-16 NOTE — Op Note (Signed)
Horace Porteousatricia L Donahoo 04/16/2015  PREOPERATIVE DIAGNOSIS:  Desire for permanent sterility  POSTOPERATIVE DIAGNOSIS:  SAA  PROCEDURE:  Laparoscopic Bilateral Tubal Ligations with Filshie Clips  ANESTHESIA:  General endotracheal  COMPLICATIONS:  None immediate.  ESTIMATED BLOOD LOSS:  Less than 20 ml.  PATHOLOGY: None  INDICATIONS: 30 y.o.  G4P4 has completed childbearing and desires permanent sterility.      FINDINGS:  Normal uterus, bilateral fallopian tubes and ovaries.  TECHNIQUE:  The patient was taken to the operating room where general anesthesia was obtained without difficulty.  She was then placed in the dorsal lithotomy position and prepared and draped in sterile fashion.  After an adequate timeout was performed, a bivalved speculum was then placed in the patient's vagina, and the anterior lip of cervix grasped with the single-tooth tenaculum.  The hulka clip was advanced into the uterus.  The speculum was removed from the vagina.  Attention was then turned to the patient's abdomen where a 10-mm skin incision was made on the umbilical fold.  The Veress needle was carefully introduced into the peritoneal cavity through the abdominal wall.  Intraperitoneal placement was confirmed by drop in intraabdominal pressure with insufflation of carbon dioxide gas.  Adequate pneumoperitoneum was obtained, and the 10mm trocar was then advanced without difficulty into the abdomen where intraabdominal placement was confirmed by the operative laparoscope.  Normal anatomy was noted.  The Filshie clip applicator was advanced through the operative scope and the first clip was applied approximately 3 cm distal to the cornual region of the right fallopian tube.  The left fallopian tube was clipped in the same fashion.  Hemostasis was observed.  Instruments were removed for the abdomen.  The fascial incision was closed with 0 Vicryl in a figure of eight fashion.  The skin incision was closed with monocryl in a  subcuticular fashion.  A Band-Aid was applied to the incision.  The uterine manipulator and the tenaculum were removed from the vagina without complications. The patient tolerated the procedure well.  Sponge, lap, and needle counts were correct times two.  The patient was then taken to the recovery room awake, extubated and in stable  in stable condition.

## 2015-04-17 ENCOUNTER — Encounter (HOSPITAL_BASED_OUTPATIENT_CLINIC_OR_DEPARTMENT_OTHER): Payer: Self-pay | Admitting: Obstetrics & Gynecology

## 2015-07-27 ENCOUNTER — Encounter (HOSPITAL_COMMUNITY): Payer: Self-pay | Admitting: *Deleted

## 2015-07-27 DIAGNOSIS — Z79899 Other long term (current) drug therapy: Secondary | ICD-10-CM | POA: Insufficient documentation

## 2015-07-27 DIAGNOSIS — F17200 Nicotine dependence, unspecified, uncomplicated: Secondary | ICD-10-CM | POA: Diagnosis not present

## 2015-07-27 DIAGNOSIS — Z5321 Procedure and treatment not carried out due to patient leaving prior to being seen by health care provider: Secondary | ICD-10-CM | POA: Diagnosis not present

## 2015-07-27 DIAGNOSIS — J029 Acute pharyngitis, unspecified: Secondary | ICD-10-CM | POA: Insufficient documentation

## 2015-07-27 NOTE — ED Notes (Signed)
Pt went to the beach and came back 1 week ago. She came in today b/c someone that went to the beach was dx with an e coli infection and said everyone needed to be checked out.  Pt is c/o sore throat.  No diarrhea, no abd pain.  No fevers.

## 2015-07-27 NOTE — ED Notes (Signed)
Children being seen in PEDS first then Pt coming to adult side 

## 2015-07-28 ENCOUNTER — Emergency Department (HOSPITAL_COMMUNITY)
Admission: EM | Admit: 2015-07-28 | Discharge: 2015-07-28 | Disposition: A | Discharge status: Home / Self Care | Payer: BLUE CROSS/BLUE SHIELD | Attending: Dermatology | Admitting: Dermatology

## 2015-07-28 ENCOUNTER — Encounter (HOSPITAL_COMMUNITY): Payer: Self-pay | Admitting: *Deleted

## 2015-07-28 ENCOUNTER — Ambulatory Visit (HOSPITAL_COMMUNITY)
Admission: EM | Admit: 2015-07-28 | Discharge: 2015-07-28 | Disposition: A | Discharge status: Home / Self Care | Payer: BLUE CROSS/BLUE SHIELD | Source: Home / Self Care | Attending: Family Medicine | Admitting: Family Medicine

## 2015-07-28 DIAGNOSIS — J3089 Other allergic rhinitis: Secondary | ICD-10-CM

## 2015-07-28 DIAGNOSIS — Z9049 Acquired absence of other specified parts of digestive tract: Secondary | ICD-10-CM | POA: Insufficient documentation

## 2015-07-28 DIAGNOSIS — H6980 Other specified disorders of Eustachian tube, unspecified ear: Secondary | ICD-10-CM | POA: Insufficient documentation

## 2015-07-28 DIAGNOSIS — F17210 Nicotine dependence, cigarettes, uncomplicated: Secondary | ICD-10-CM

## 2015-07-28 DIAGNOSIS — Z9889 Other specified postprocedural states: Secondary | ICD-10-CM

## 2015-07-28 DIAGNOSIS — J310 Chronic rhinitis: Secondary | ICD-10-CM | POA: Insufficient documentation

## 2015-07-28 DIAGNOSIS — H6983 Other specified disorders of Eustachian tube, bilateral: Secondary | ICD-10-CM | POA: Diagnosis not present

## 2015-07-28 DIAGNOSIS — Z79899 Other long term (current) drug therapy: Secondary | ICD-10-CM | POA: Insufficient documentation

## 2015-07-28 LAB — POCT RAPID STREP A: STREPTOCOCCUS, GROUP A SCREEN (DIRECT): NEGATIVE

## 2015-07-28 MED ORDER — FLUTICASONE PROPIONATE 50 MCG/ACT NA SUSP: 2.0000 | Freq: Every day | NASAL | Status: DC

## 2015-07-28 NOTE — ED Notes (Signed)
Pt  Reports  synptoms  Of  sorethroat   With  Pain  whe  She  Swallows      As   Well  As   Bilateral  Earache         And      Headache      -  Pt reports  The  symotoms  Began      Yesterday

## 2015-07-28 NOTE — ED Provider Notes (Signed)
CSN: [id number]     Arrival date & time 07/28/15  1204 History   First MD Initiated Contact with Patient 07/28/15 1420     Chief Complaint  Patient presents with  . Sore Throat   (Consider location/radiation/quality/duration/timing/severity/associated sxs/prior Treatment) Patient is a 30 y.o. female presenting with pharyngitis.  Sore Throat    Past Medical History  Diagnosis Date  . No pertinent past medical history   . Family history of adverse reaction to anesthesia   . Wears glasses   . Heart murmur     cardiac eval 2010   Past Surgical History  Procedure Laterality Date  . Gallbladder surgery    . Wisdom tooth extraction    . Cholecystectomy    . Laparoscopic tubal ligation Bilateral 04/16/2015    Procedure: LAPAROSCOPIC TUBAL LIGATION WITH FILSHIE CLIPS;  Surgeon: Mitchel HonourMegan Morris, DO;  [facility];  Service: Gynecology;  Laterality: Bilateral;   Family History  Problem Relation Age of Onset  . Heart attack Father   . Cancer Paternal Aunt     breast  . Diabetes Paternal Grandfather    Social History  Substance Use Topics  . Smoking status: Current Every Day Smoker -- 1.00 packs/day  . Smokeless tobacco: None  . Alcohol Use: No   OB History    Gravida Para Term Preterm AB TAB SAB Ectopic Multiple Living   4 4 4       0 4     Review of Systems  Constitutional: Negative.   HENT: Positive for postnasal drip, rhinorrhea and sore throat.   Eyes: Negative.   Respiratory: Negative.   Cardiovascular: Negative.   Endocrine: Negative.   Genitourinary: Negative.   Musculoskeletal: Negative.   Allergic/Immunologic: Negative.   Neurological: Negative.   Hematological: Negative.   Psychiatric/Behavioral: Negative.     Allergies  Bupivacaine; Lidocaine viscous; Other; and Pb-hyoscy-atropine-scopolamine  Home Medications   Prior to Admission medications   Medication Sig Start Date End Date Taking? Authorizing Provider  acetaminophen (TYLENOL)  500 MG tablet Take 1,000 mg by mouth every 6 (six) hours as needed for mild pain.    Historical Provider, MD  albuterol (PROVENTIL HFA;VENTOLIN HFA) 108 (90 BASE) MCG/ACT inhaler Inhale 2 puffs into the lungs every 6 (six) hours as needed for wheezing or shortness of breath. 11/05/13   Rodolph BongEvan S Corey, MD  calcium carbonate (TUMS - DOSED IN MG ELEMENTAL CALCIUM) 500 MG chewable tablet Chew 1 tablet by mouth 2 (two) times daily as needed for indigestion.     Historical Provider, MD  ibuprofen (ADVIL,MOTRIN) 800 MG tablet Take 1 tablet (800 mg total) by mouth every 6 (six) hours as needed. 04/16/15   Megan Morris, DO  oxyCODONE-acetaminophen (ROXICET) 5-325 MG tablet Take 1-2 tablets by mouth every 4 (four) hours as needed for severe pain. 04/16/15   Megan Morris, DO  ranitidine (ZANTAC) 150 MG tablet Take 150 mg by mouth at bedtime.    Historical Provider, MD   Meds Ordered and Administered this Visit  Medications - No data to display  BP 132/78 mmHg  Pulse 78  Temp(Src) 98.6 F (37 C) (Oral)  Resp 18  SpO2 99%  LMP 07/28/2015 (Exact Date) No data found.   Physical Exam  Constitutional: She appears well-developed and well-nourished.  HENT:  Head: Normocephalic.  Right Ear: External ear normal.  Left Ear: External ear normal.  Mouth/Throat: Oropharynx is clear and moist.  Eyes: Conjunctivae are normal. Pupils are equal, round, and reactive to  light.  Neck: Normal range of motion. Neck supple.  Cardiovascular: Normal rate, regular rhythm and normal heart sounds.   Pulmonary/Chest: Effort normal and breath sounds normal.  Abdominal: Soft. Bowel sounds are normal.    ED Course  Procedures (including critical care time)  Labs Review Labs Reviewed - No data to display  Imaging Review No results found.   Visual Acuity Review  Right Eye Distance:   Left Eye Distance:   Bilateral Distance:    Right Eye Near:   Left Eye Near:    Bilateral Near:         MDM  Eustachian tube  dysfunction - Get sudafed otc and take as directed  Rhinitis - Flonase NS 1-2 sprays per nostril qd #1 w/1rf  Warm Salt Water Gargles prn     Deatra Canter, FNP 07/28/15 1501

## 2015-07-28 NOTE — Discharge Instructions (Signed)
Take sudafed over the counter as directed.  Allergic Rhinitis Allergic rhinitis is when the mucous membranes in the nose respond to allergens. Allergens are particles in the air that cause your body to have an allergic reaction. This causes you to release allergic antibodies. Through a chain of events, these eventually cause you to release histamine into the blood stream. Although meant to protect the body, it is this release of histamine that causes your discomfort, such as frequent sneezing, congestion, and an itchy, runny nose.  CAUSES Seasonal allergic rhinitis (hay fever) is caused by pollen allergens that may come from grasses, trees, and weeds. Year-round allergic rhinitis (perennial allergic rhinitis) is caused by allergens such as house dust mites, pet dander, and mold spores. SYMPTOMS  Nasal stuffiness (congestion).  Itchy, runny nose with sneezing and tearing of the eyes. DIAGNOSIS Your health care provider can help you determine the allergen or allergens that trigger your symptoms. If you and your health care provider are unable to determine the allergen, skin or blood testing may be used. Your health care provider will diagnose your condition after taking your health history and performing a physical exam. Your health care provider may assess you for other related conditions, such as asthma, pink eye, or an ear infection. TREATMENT Allergic rhinitis does not have a cure, but it can be controlled by:  Medicines that block allergy symptoms. These may include allergy shots, nasal sprays, and oral antihistamines.  Avoiding the allergen. Hay fever may often be treated with antihistamines in pill or nasal spray forms. Antihistamines block the effects of histamine. There are over-the-counter medicines that may help with nasal congestion and swelling around the eyes. Check with your health care provider before taking or giving this medicine. If avoiding the allergen or the medicine  prescribed do not work, there are many new medicines your health care provider can prescribe. Stronger medicine may be used if initial measures are ineffective. Desensitizing injections can be used if medicine and avoidance does not work. Desensitization is when a patient is given ongoing shots until the body becomes less sensitive to the allergen. Make sure you follow up with your health care provider if problems continue. HOME CARE INSTRUCTIONS It is not possible to completely avoid allergens, but you can reduce your symptoms by taking steps to limit your exposure to them. It helps to know exactly what you are allergic to so that you can avoid your specific triggers. SEEK MEDICAL CARE IF:  You have a fever.  You develop a cough that does not stop easily (persistent).  You have shortness of breath.  You start wheezing.  Symptoms interfere with normal daily activities.   This information is not intended to replace advice given to you by your health care provider. Make sure you discuss any questions you have with your health care provider.   Document Released: 09/21/2000 Document Revised: 01/17/2014 Document Reviewed: 09/03/2012 Elsevier Interactive Patient Education [employer]2016 Elsevier Inc.

## 2015-07-28 NOTE — ED Notes (Signed)
pts name called for vital sign reassessment, no answer. 

## 2015-07-31 LAB — CULTURE, GROUP A STREP (THRC)

## 2015-09-29 ENCOUNTER — Encounter: Payer: Self-pay | Admitting: Neurology

## 2015-09-29 ENCOUNTER — Ambulatory Visit (INDEPENDENT_AMBULATORY_CARE_PROVIDER_SITE_OTHER): Payer: BLUE CROSS/BLUE SHIELD | Admitting: Neurology

## 2015-09-29 VITALS — BP 128/78 | HR 90 | Resp 16 | Ht 62.5 in | Wt 215.0 lb

## 2015-09-29 DIAGNOSIS — R0683 Snoring: Secondary | ICD-10-CM | POA: Diagnosis not present

## 2015-09-29 DIAGNOSIS — G4710 Hypersomnia, unspecified: Secondary | ICD-10-CM | POA: Diagnosis not present

## 2015-09-29 DIAGNOSIS — Z720 Tobacco use: Secondary | ICD-10-CM | POA: Diagnosis not present

## 2015-09-29 DIAGNOSIS — R51 Headache: Secondary | ICD-10-CM

## 2015-09-29 DIAGNOSIS — R519 Headache, unspecified: Secondary | ICD-10-CM

## 2015-09-29 DIAGNOSIS — Z789 Other specified health status: Secondary | ICD-10-CM

## 2015-09-29 DIAGNOSIS — R351 Nocturia: Secondary | ICD-10-CM

## 2015-09-29 DIAGNOSIS — F17200 Nicotine dependence, unspecified, uncomplicated: Secondary | ICD-10-CM

## 2015-09-29 DIAGNOSIS — E669 Obesity, unspecified: Secondary | ICD-10-CM

## 2015-09-29 DIAGNOSIS — Z9189 Other specified personal risk factors, not elsewhere classified: Secondary | ICD-10-CM | POA: Diagnosis not present

## 2015-09-29 NOTE — Progress Notes (Signed)
Subjective:    Patient ID: [name] is a 30 y.o. female.  HPI     Huston Foley, MD, PhD [facility] [address] [address] [place], [place] [address]  Dear Maralyn Sago,   I saw your patient, [name], upon your kind request in my neurologic clinic today for initial consultation of her sleep disorder, in particular, concern for underlying obstructive sleep apnea. The patient is unaccompanied today. As you know, [name] is a 30 year old right-handed woman with an underlying medical history of depression, smoking and obesity, who reports snoring and excessive daytime somnolence, with worsening symptoms of the past 3 months, including waking up with a sense of gasping for air. I reviewed your office note from 09/24/2015.  She snores loudly at times, per husband. Her Epworth sleepiness score is 16 out of 24 today, her fatigue score is 63 out of 63. She denies falling asleep at the wheel. She smokes about 1 1/2 ppd, drinks alcohol very rarely, but drinks a lot of caffeine, in the form of [facility], 4 cans per day on average. She has 4 children, ages 44, 71, 16, and almost 2 yo. She does not watch TV in bed, no pets in the bedroom. Her bedtime is between 10:30 and 11 PM, wakeup time is around 6 AM. 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 She is not aware of any family history of OSA. She has had morning headaches in the past few months. This can be of one sided and throbbing headache but is not associated with nausea, vomiting, photophobia or sonophobia. She does not have a prior history of migraine headaches. She is a restless sleeper but is not aware of any PLMS. She denies sleep attacks, sleep paralysis, hallucinations, and denies frank RLS Sx, has nocturia about 1-2   Her Past Medical History Is Significant For: Past Medical History:  Diagnosis Date  . Depression   . Family history of adverse reaction to anesthesia   . Heart murmur    cardiac  eval 2010  . No pertinent past medical history   . Tobacco abuse   . Wears glasses     Her Past Surgical History Is Significant For: Past Surgical History:  Procedure Laterality Date  . CHOLECYSTECTOMY    . GALLBLADDER SURGERY    . LAPAROSCOPIC TUBAL LIGATION Bilateral 04/16/2015   Procedure: LAPAROSCOPIC TUBAL LIGATION WITH FILSHIE CLIPS;  Surgeon: Mitchel Honour, DO;  Location: [facility];  Service: Gynecology;  Laterality: Bilateral;  . WISDOM TOOTH EXTRACTION      Her Family History Is Significant For: Family History  Problem Relation Age of Onset  . Heart attack Father   . Cancer Paternal Aunt     breast  . Diabetes Paternal Grandfather     Her Social History Is Significant For: Social History   Social History  . Marital status: Married    Spouse name: N/A  . Number of children: 4  . Years of education: HS   Social History Main Topics  . Smoking status: Current Every Day Smoker    Packs/day: 1.00  . Smokeless tobacco: Never Used  . Alcohol use Yes     Comment: Rare  . Drug use: No  . Sexual activity: Yes    Birth control/ protection: None   Other Topics Concern  . None   Social History Narrative   Patient drinks about 4 sodas a day     Her Allergies Are:  Allergies  Allergen Reactions  . Other Hives    GI cocktail  . Bupivacaine Hives    Patient states she's allergic to epidurals. Rxn is tolerable per pt report, requesting epidural per RN request Needs benadryl w epidurals  . Lidocaine Viscous Hives    Pt's rxn is tolerable per pt/RN report  . Pb-Hyoscy-Atropine-Scopolamine Hives    Pt doesn't recall incident  :   Her Current Medications Are:  Outpatient Encounter Prescriptions as of 09/29/2015  Medication Sig  . acetaminophen (TYLENOL) 500 MG tablet Take 1,000 mg by mouth every 6 (six) hours as needed for mild pain.  Marland Kitchen acetaminophen (TYLENOL) 500 MG tablet Take by mouth.  Marland Kitchen albuterol (PROVENTIL HFA;VENTOLIN HFA) 108 (90 BASE)  MCG/ACT inhaler Inhale 2 puffs into the lungs every 6 (six) hours as needed for wheezing or shortness of breath.  . oxyCODONE-acetaminophen (ROXICET) 5-325 MG tablet Take 1-2 tablets by mouth every 4 (four) hours as needed for severe pain.  . varenicline (CHANTIX) 1 MG tablet Take by mouth.  . [DISCONTINUED] calcium carbonate (TUMS - DOSED IN MG ELEMENTAL CALCIUM) 500 MG chewable tablet Chew 1 tablet by mouth 2 (two) times daily as needed for indigestion.   . [DISCONTINUED] fluticasone (FLONASE) 50 MCG/ACT nasal spray Place 2 sprays into both nostrils daily.  . [DISCONTINUED] ibuprofen (ADVIL,MOTRIN) 800 MG tablet Take 1 tablet (800 mg total) by mouth every 6 (six) hours as needed.  . [DISCONTINUED] ranitidine (ZANTAC) 150 MG tablet Take 150 mg by mouth at bedtime.   No facility-administered encounter medications on file as of 09/29/2015.   :  Review of Systems:  Out of a complete 14 point review of systems, all are reviewed and negative with the exception of these symptoms as listed below: Review of Systems  Neurological:       Patient has trouble staying asleep, snoring, wakes up choking, wakes up feeling tired, morning headaches, daytime tiredness, takes naps.    Epworth Sleepiness Scale 0= would never doze 1= slight chance of dozing 2= moderate chance of dozing 3= high chance of dozing  Sitting and reading:2 Watching TV:3 Sitting inactive in a public place (ex. Theater or meeting):3 As a passenger in a car for an hour without a break:3 Lying down to rest in the afternoon:3 Sitting and talking to someone:0 Sitting quietly after lunch (no alcohol):2 In a car, while stopped in traffic:0 Total:16  Objective:  Neurologic Exam  Physical Exam Physical Examination:   Vitals:   09/29/15 1119  BP: 128/78  Pulse: 90  Resp: 16    General Examination: The patient is a very pleasant 30 y.o. female in no acute distress. She appears well-developed and well-nourished and well groomed.    HEENT: Normocephalic, atraumatic, pupils are equal, round and reactive to light and accommodation. Funduscopic exam is normal with sharp disc margins noted. Extraocular tracking is good without limitation to gaze excursion or nystagmus noted. Normal smooth pursuit is noted. Hearing is grossly intact. Face is symmetric with normal facial animation and normal facial sensation. Speech is clear with no dysarthria noted. There is no hypophonia. There is no lip, neck/head, jaw or voice tremor. Neck is supple with full range of passive and active motion. There are no carotid bruits on auscultation. Oropharynx exam reveals: mild mouth dryness, good dental hygiene and moderate airway crowding, due to smaller airway entry, larger appearing uvula and tonsils in place. Mallampati is class II. Tongue protrudes centrally and palate elevates symmetrically. Tonsils are 2+, right a little  larger than left. Neck size is 15.25 inches. She has a vary mild overbite. Nasal inspection reveals no significant nasal mucosal bogginess or redness and no septal deviation.   Chest: Clear to auscultation without wheezing, rhonchi or crackles noted.  Heart: S1+S2+0, regular and normal without murmurs, rubs or gallops noted.   Abdomen: Soft, non-tender and non-distended with normal bowel sounds appreciated on auscultation.  Extremities: There is no pitting edema in the distal lower extremities bilaterally. Pedal pulses are intact.  Skin: Warm and dry without trophic changes noted. There are no varicose veins.  Musculoskeletal: exam reveals no obvious joint deformities, tenderness or joint swelling or erythema.   Neurologically:  Mental status: The patient is awake, alert and oriented in all 4 spheres. Her immediate and remote memory, attention, language skills and fund of knowledge are appropriate. There is no evidence of aphasia, agnosia, apraxia or anomia. Speech is clear with normal prosody and enunciation. Thought process is  linear. Mood is normal and affect is normal.  Cranial nerves II - XII are as described above under HEENT exam. In addition: shoulder shrug is normal with equal shoulder height noted. Motor exam: Normal bulk, strength and tone is noted. There is no drift, tremor or rebound. Romberg is negative. Reflexes are 2+ throughout. Babinski: Toes are flexor bilaterally. Fine motor skills and coordination: intact with normal finger taps, normal hand movements, normal rapid alternating patting, normal foot taps and normal foot agility.  Cerebellar testing: No dysmetria or intention tremor on finger to nose testing. Heel to shin is unremarkable bilaterally. There is no truncal or gait ataxia.  Sensory exam: intact to light touch, pinprick, vibration, temperature sense in the upper and lower extremities.  Gait, station and balance: She stands easily. No veering to one side is noted. No leaning to one side is noted. Posture is age-appropriate and stance is narrow based. Gait shows normal stride length and normal pace. No problems turning are noted. Tandem walk is unremarkable.              Assessment and Plan:  In summary, HA SHANNAHAN is a very pleasant 30 y.o.-year old female with an underlying medical history of depression, smoking and obesity, whose history and physical exam are concerning for obstructive sleep apnea (OSA). I had a long chat with the patient about my findings and the diagnosis of OSA, its prognosis and treatment options. We talked about medical treatments, surgical interventions and non-pharmacological approaches. I explained in particular the risks and ramifications of untreated moderate to severe OSA, especially with respect to developing cardiovascular disease down the Road, including congestive heart failure, difficult to treat hypertension, cardiac arrhythmias, or stroke. Even type 2 diabetes has, in part, been linked to untreated OSA. Symptoms of untreated OSA include daytime sleepiness,  memory problems, mood irritability and mood disorder such as depression and anxiety, lack of energy, as well as recurrent headaches, especially morning headaches. We talked about smoking cessation and trying to maintain a healthy lifestyle in general, as well as the importance of weight control. I encouraged the patient to eat healthy, exercise daily and keep well hydrated, to keep a scheduled bedtime and wake time routine, to not skip any meals and eat healthy snacks in between meals. I advised the patient not to drive when feeling sleepy. The patient was advised to gradually reduce her caffeine intake, and increase her water intake. I recommended the following at this time: sleep study with potential positive airway pressure titration. (We will score hypopneas  at 3% and split the sleep study into diagnostic and treatment portion, if the estimated. 2 hour AHI is >15/h).   I explained the sleep test procedure to the patient and also outlined possible surgical and non-surgical treatment options of OSA, including the use of a custom-made dental device (which would require a referral to a specialist dentist or oral surgeon), upper airway surgical options, such as pillar implants, radiofrequency surgery, tongue base surgery, and UPPP (which would involve a referral to an ENT surgeon). Rarely, jaw surgery such as mandibular advancement may be considered.  I also explained the CPAP treatment option to the patient, who indicated that she would be willing to try CPAP if the need arises. I explained the importance of being compliant with PAP treatment, not only for insurance purposes but primarily to improve Her symptoms, and for the patient's long term health benefit, including to reduce Her cardiovascular risks. I answered all her questions today and the patient was in agreement. I would like to see her back after the sleep study is completed and encouraged her to call with any interim questions, concerns, problems or  updates.   Thank you very much for allowing me to participate in the care of this nice patient. If I can be of any further assistance to you please do not hesitate to call me at [phone].  Sincerely,   Huston FoleySaima Alya Smaltz, MD, PhD

## 2015-09-29 NOTE — Patient Instructions (Signed)

## 2015-12-21 ENCOUNTER — Other Ambulatory Visit: Payer: Self-pay | Admitting: Nurse Practitioner

## 2015-12-21 DIAGNOSIS — Z1231 Encounter for screening mammogram for malignant neoplasm of breast: Secondary | ICD-10-CM

## 2015-12-22 ENCOUNTER — Other Ambulatory Visit: Payer: Self-pay | Admitting: Nurse Practitioner

## 2015-12-22 DIAGNOSIS — N6313 Unspecified lump in the right breast, lower outer quadrant: Secondary | ICD-10-CM

## 2015-12-29 ENCOUNTER — Ambulatory Visit
Admission: RE | Admit: 2015-12-29 | Discharge: 2015-12-29 | Disposition: A | Discharge status: Home / Self Care | Payer: BLUE CROSS/BLUE SHIELD | Source: Ambulatory Visit | Attending: Nurse Practitioner | Admitting: Nurse Practitioner

## 2015-12-29 DIAGNOSIS — N6313 Unspecified lump in the right breast, lower outer quadrant: Secondary | ICD-10-CM

## 2016-04-18 ENCOUNTER — Telehealth: Payer: Self-pay

## 2016-04-18 NOTE — Telephone Encounter (Signed)
Called patient to schedule sleep study 3 times. Have not heard back.  

## 2017-07-31 ENCOUNTER — Other Ambulatory Visit: Payer: Self-pay

## 2017-07-31 ENCOUNTER — Emergency Department (HOSPITAL_COMMUNITY)
Admission: EM | Admit: 2017-07-31 | Discharge: 2017-07-31 | Disposition: A | Discharge status: Home / Self Care | Payer: Self-pay | Attending: Emergency Medicine | Admitting: Emergency Medicine

## 2017-07-31 ENCOUNTER — Encounter (HOSPITAL_COMMUNITY): Payer: Self-pay | Admitting: Emergency Medicine

## 2017-07-31 ENCOUNTER — Emergency Department (HOSPITAL_COMMUNITY): Payer: Self-pay

## 2017-07-31 DIAGNOSIS — K625 Hemorrhage of anus and rectum: Secondary | ICD-10-CM | POA: Insufficient documentation

## 2017-07-31 DIAGNOSIS — K640 First degree hemorrhoids: Secondary | ICD-10-CM | POA: Insufficient documentation

## 2017-07-31 DIAGNOSIS — F17210 Nicotine dependence, cigarettes, uncomplicated: Secondary | ICD-10-CM | POA: Insufficient documentation

## 2017-07-31 LAB — GASTROINTESTINAL PANEL BY PCR, STOOL (REPLACES STOOL CULTURE)
Adenovirus F40/41: NOT DETECTED
Astrovirus: NOT DETECTED
Campylobacter species: NOT DETECTED
Cryptosporidium: NOT DETECTED
Cyclospora cayetanensis: NOT DETECTED
ENTEROTOXIGENIC E COLI (ETEC): NOT DETECTED
Entamoeba histolytica: NOT DETECTED
Enteroaggregative E coli (EAEC): NOT DETECTED
Enteropathogenic E coli (EPEC): NOT DETECTED
Giardia lamblia: NOT DETECTED
Norovirus GI/GII: NOT DETECTED
Plesimonas shigelloides: NOT DETECTED
Rotavirus A: NOT DETECTED
SHIGA LIKE TOXIN PRODUCING E COLI (STEC): NOT DETECTED
Salmonella species: NOT DETECTED
Sapovirus (I, II, IV, and V): NOT DETECTED
Shigella/Enteroinvasive E coli (EIEC): NOT DETECTED
VIBRIO CHOLERAE: NOT DETECTED
Vibrio species: NOT DETECTED
Yersinia enterocolitica: NOT DETECTED

## 2017-07-31 LAB — CBC
HCT: 37.7 % (ref 36.0–46.0)
HEMOGLOBIN: 12.2 g/dL (ref 12.0–15.0)
MCH: 29.1 pg (ref 26.0–34.0)
MCHC: 32.4 g/dL (ref 30.0–36.0)
MCV: 90 fL (ref 78.0–100.0)
Platelets: 252 10*3/uL (ref 150–400)
RBC: 4.19 MIL/uL (ref 3.87–5.11)
RDW: 12 % (ref 11.5–15.5)
WBC: 5.3 10*3/uL (ref 4.0–10.5)

## 2017-07-31 LAB — COMPREHENSIVE METABOLIC PANEL
ALT: 34 U/L (ref 0–44)
ANION GAP: 7 (ref 5–15)
AST: 27 U/L (ref 15–41)
Albumin: 3.7 g/dL (ref 3.5–5.0)
Alkaline Phosphatase: 47 U/L (ref 38–126)
BUN: 12 mg/dL (ref 6–20)
CALCIUM: 8.8 mg/dL — AB (ref 8.9–10.3)
CHLORIDE: 105 mmol/L (ref 98–111)
CO2: 26 mmol/L (ref 22–32)
Creatinine, Ser: 0.89 mg/dL (ref 0.44–1.00)
GFR calc non Af Amer: >60 mL/min (ref >60)
Glucose, Bld: 106 mg/dL — ABNORMAL HIGH (ref 70–99)
Potassium: 4.1 mmol/L (ref 3.5–5.1)
SODIUM: 138 mmol/L (ref 135–145)
Total Bilirubin: 0.4 mg/dL (ref 0.3–1.2)
Total Protein: 6.8 g/dL (ref 6.5–8.1)

## 2017-07-31 LAB — I-STAT BETA HCG BLOOD, ED (MC, WL, AP ONLY)

## 2017-07-31 LAB — POC OCCULT BLOOD, ED: FECAL OCCULT BLD: NEGATIVE

## 2017-07-31 MED ORDER — DICYCLOMINE HCL 10 MG PO CAPS
20.0000 mg | ORAL_CAPSULE | Freq: Once | ORAL | Status: AC
  Administered 2017-07-31: 20 mg via ORAL
  Filled 2017-07-31: qty 2

## 2017-07-31 MED ORDER — DICYCLOMINE HCL 20 MG PO TABS: 20.0000 mg | ORAL_TABLET | Freq: Three times a day (TID) | ORAL | 0 refills | Status: AC

## 2017-07-31 MED ORDER — HYDROCORTISONE ACETATE 25 MG RE SUPP: 25.0000 mg | Freq: Two times a day (BID) | RECTAL | 0 refills | Status: AC

## 2017-07-31 NOTE — Discharge Instructions (Addendum)
As we discussed, we have sent a GI panel to test your stool.  Take the meds as prescribed.  If you need antibiotics, we will call you with the results.  I'd recommend an over-the-counter stool softener like Colace once the diarrhea improves.

## 2017-07-31 NOTE — ED Triage Notes (Signed)
Pt. Stated, I started having blood in my stool 10 days ago. Yesterday it was straight blood.

## 2017-07-31 NOTE — ED Provider Notes (Addendum)
[facility] EMERGENCY DEPARTMENT Provider Note   CSN: [id number] Arrival date & time: 07/31/17  0901     History   Chief Complaint Chief Complaint  Patient presents with  . Rectal Bleeding    HPI JESSICE MADILL is a 32 y.o. female.  HPI 32 year old female with past medical history as below here with rectal bleeding.  The patient states that over the last 2 weeks, she has had persistent diarrhea.  It was initially nonbloody.  She does not know any specific sick contacts.  Over the last several days to week, she has began to have initially streaks of bright red blood, and no grossly bloody diarrhea.  She has some occasional intermittent abdominal cramping when she feels like she has to go to the bathroom.  She has not taken anything because she was told that she should not take antidiarrheals if she has an infection.  Denies any recent travel outside the [place].  She did recently go on vacation to the beach, but states she did not eat anything suspicious that she is aware of.  Symptoms started after her trip to the beach.  She was around multiple people at the beach.  Denies any current pain.  No vaginal bleeding or discharge.  No urinary symptoms.  Past Medical History:  Diagnosis Date  . Depression   . Family history of adverse reaction to anesthesia   . Heart murmur    cardiac eval 2010  . No pertinent past medical history   . Tobacco abuse   . Wears glasses     Patient Active Problem List   Diagnosis Date Noted  . Normal pregnancy 01/17/2014  . Spontaneous vaginal delivery 01/17/2014    Class: Status post  . Tachycardia 04/29/2008  . MURMUR 04/29/2008  . SYNCOPE 03/17/2008  . PALPITATIONS 03/17/2008    Past Surgical History:  Procedure Laterality Date  . CHOLECYSTECTOMY    . GALLBLADDER SURGERY    . LAPAROSCOPIC TUBAL LIGATION Bilateral 04/16/2015   Procedure: LAPAROSCOPIC TUBAL LIGATION WITH FILSHIE CLIPS;  Surgeon: Mitchel Honour, DO;   Location: [facility];  Service: Gynecology;  Laterality: Bilateral;  . WISDOM TOOTH EXTRACTION       OB History    Gravida  4   Para  4   Term  4   Preterm      AB      Living  4     SAB      TAB      Ectopic      Multiple  0   Live Births  4            Home Medications    Prior to Admission medications   Medication Sig Start Date End Date Taking? Authorizing Provider  acetaminophen (TYLENOL) 500 MG tablet Take 1,000 mg by mouth every 6 (six) hours as needed for mild pain.   Yes [provider]  albuterol (PROVENTIL HFA;VENTOLIN HFA) 108 (90 BASE) MCG/ACT inhaler Inhale 2 puffs into the lungs every 6 (six) hours as needed for wheezing or shortness of breath. 11/05/13  Yes Rodolph Bong, MD  loperamide (IMODIUM) 2 MG capsule Take 4 mg by mouth as needed for diarrhea or loose stools.   Yes [provider]  dicyclomine (BENTYL) 20 MG tablet Take 1 tablet (20 mg total) by mouth 4 (four) times daily -  before meals and at bedtime for 5 days. 07/31/17 08/05/17  Shaune Pollack, MD  hydrocortisone (  ANUSOL-HC) 25 MG suppository Place 1 suppository (25 mg total) rectally 2 (two) times daily. 07/31/17   Shaune Pollack, MD  oxyCODONE-acetaminophen (ROXICET) 5-325 MG tablet Take 1-2 tablets by mouth every 4 (four) hours as needed for severe pain. Patient not taking: Reported on 07/31/2017 04/16/15   Mitchel Honour, DO    Family History Family History  Problem Relation Age of Onset  . Heart attack Father   . Cancer Paternal Aunt        breast  . Diabetes Paternal Grandfather     Social History Social History   Tobacco Use  . Smoking status: Current Every Day Smoker    Packs/day: 1.00  . Smokeless tobacco: Never Used  Substance Use Topics  . Alcohol use: Yes    Comment: Rare  . Drug use: No     Allergies   Other; Bupivacaine; Lidocaine viscous; and Pb-hyoscy-atropine-scopolamine   Review of Systems Review of Systems    Constitutional: Positive for fatigue. Negative for chills and fever.  HENT: Negative for congestion, rhinorrhea and sore throat.   Eyes: Negative for visual disturbance.  Respiratory: Negative for cough, shortness of breath and wheezing.   Cardiovascular: Negative for chest pain and leg swelling.  Gastrointestinal: Positive for blood in stool and diarrhea. Negative for abdominal pain, nausea and vomiting.  Genitourinary: Negative for dysuria, flank pain, vaginal bleeding and vaginal discharge.  Musculoskeletal: Negative for neck pain.  Skin: Negative for rash.  Allergic/Immunologic: Negative for immunocompromised state.  Neurological: Positive for weakness. Negative for syncope and headaches.  Hematological: Does not bruise/bleed easily.  All other systems reviewed and are negative.    Physical Exam Updated Vital Signs BP 109/68   Pulse 76   Temp 98.3 F (36.8 C) (Oral)   Resp 18   Ht 5\' 2"  (1.575 m)   Wt 104.3 kg (230 lb)   LMP 07/13/2017   SpO2 100%   BMI 42.07 kg/m   Physical Exam  Constitutional: She is oriented to person, place, and time. She appears well-developed and well-nourished. No distress.  HENT:  Head: Normocephalic and atraumatic.  Eyes: Conjunctivae are normal.  Neck: Neck supple.  Cardiovascular: Normal rate, regular rhythm and normal heart sounds. Exam reveals no friction rub.  No murmur heard. Pulmonary/Chest: Effort normal and breath sounds normal. No respiratory distress. She has no wheezes. She has no rales.  Abdominal: Soft. She exhibits no distension. There is no tenderness. There is no rebound and no guarding.  No TTP.  Genitourinary:  Genitourinary Comments: Multiple external hemorrhoids. No signs of active bleeding. Brown stool in vault.  Musculoskeletal: She exhibits no edema.  Neurological: She is alert and oriented to person, place, and time. She exhibits normal muscle tone.  Skin: Skin is warm. Capillary refill takes less than 2 seconds.   Psychiatric: She has a normal mood and affect.  Nursing note and vitals reviewed.    ED Treatments / Results  Labs (all labs ordered are listed, but only abnormal results are displayed) Labs Reviewed  COMPREHENSIVE METABOLIC PANEL - Abnormal; Notable for the following components:      Result Value   Glucose, Bld 106 (*)    Calcium 8.8 (*)    All other components within normal limits  GASTROINTESTINAL PANEL BY PCR, STOOL (REPLACES STOOL CULTURE)  CBC  POC OCCULT BLOOD, ED  I-STAT BETA HCG BLOOD, ED ([facility], [facility], [facility] ONLY)  POC OCCULT BLOOD, ED  TYPE AND SCREEN    EKG None  Radiology Dg Abdomen Acute  W/chest  Result Date: 07/31/2017 CLINICAL DATA:  Episode of blood in stool 10 days ago. Diarrhea with blood yesterday. EXAM: DG ABDOMEN ACUTE W/ 1V CHEST COMPARISON:  PA and lateral chest 11/13/2010. FINDINGS: Single-view of the chest demonstrates clear lungs and normal heart size. No pneumothorax or pleural effusion. No acute or focal bony abnormality. Two views of the abdomen show no free intraperitoneal air. The bowel gas pattern is nonobstructive. Tubal ligation and cholecystectomy clips noted. No abnormal abdominal calcification or bony abnormality is seen. IMPRESSION: Negative exam. Electronically Signed   By: Drusilla Kannerhomas  Dalessio M.D.   On: 07/31/2017 12:21    Procedures Procedures (including critical care time)  Medications Ordered in ED Medications  dicyclomine (BENTYL) capsule 20 mg (20 mg Oral Given 07/31/17 1241)     Initial Impression / Assessment and Plan / ED Course  I have reviewed the triage vital signs and the nursing notes.  Pertinent labs & imaging results that were available during my care of the patient were reviewed by me and considered in my medical decision making (see chart for details).     32 year old female here with 2 weeks of diarrhea, now with occasional blood-tinged stool.  I suspect she has viral GI illness versus IBS versus food-borne illness.   Hemoglobin is stable here.  I suspect her blood is from mucosal irritation as well as hemorrhoids, which are noted on exam.  KUB is without obstruction. She has absolutely no abdominal TTP to suggest focal appendicitis, diverticulitis, cholecystitis or other abd emergency.  She has been stable in the ED.  I have sent a GI pathogen panel.  Will hold on antibiotics given her bloody diarrhea, follow-up for panel.  Will give Bentyl for her cramps and spasms as well as Anusol for her hemorrhoids.  Final Clinical Impressions(s) / ED Diagnoses   Final diagnoses:  Rectal bleeding  Grade I hemorrhoids    ED Discharge Orders        Ordered    hydrocortisone (ANUSOL-HC) 25 MG suppository  2 times daily     07/31/17 1437    dicyclomine (BENTYL) 20 MG tablet  3 times daily before meals & bedtime     07/31/17 1437       Shaune PollackIsaacs, Ludie Hudon, MD 07/31/17 1946    Shaune PollackIsaacs, Kjersti Dittmer, MD 07/31/17 [phone]

## 2017-08-01 LAB — TYPE AND SCREEN
ABO/RH(D): A NEG
Antibody Screen: NEGATIVE

## 2017-08-04 ENCOUNTER — Encounter: Payer: Self-pay | Admitting: Gastroenterology

## 2017-10-03 ENCOUNTER — Ambulatory Visit: Payer: Medicaid Other | Admitting: Gastroenterology

## 2020-03-04 IMAGING — DX DG ABDOMEN ACUTE W/ 1V CHEST
4 series · 4 of 4 positions shown · non-contrast
Comparison: PA and lateral chest 11/13/2010.

CLINICAL DATA: Episode of blood in stool 10 days ago. Diarrhea with
blood yesterday.

EXAM:
DG ABDOMEN ACUTE W/ 1V CHEST

[w chest pa]
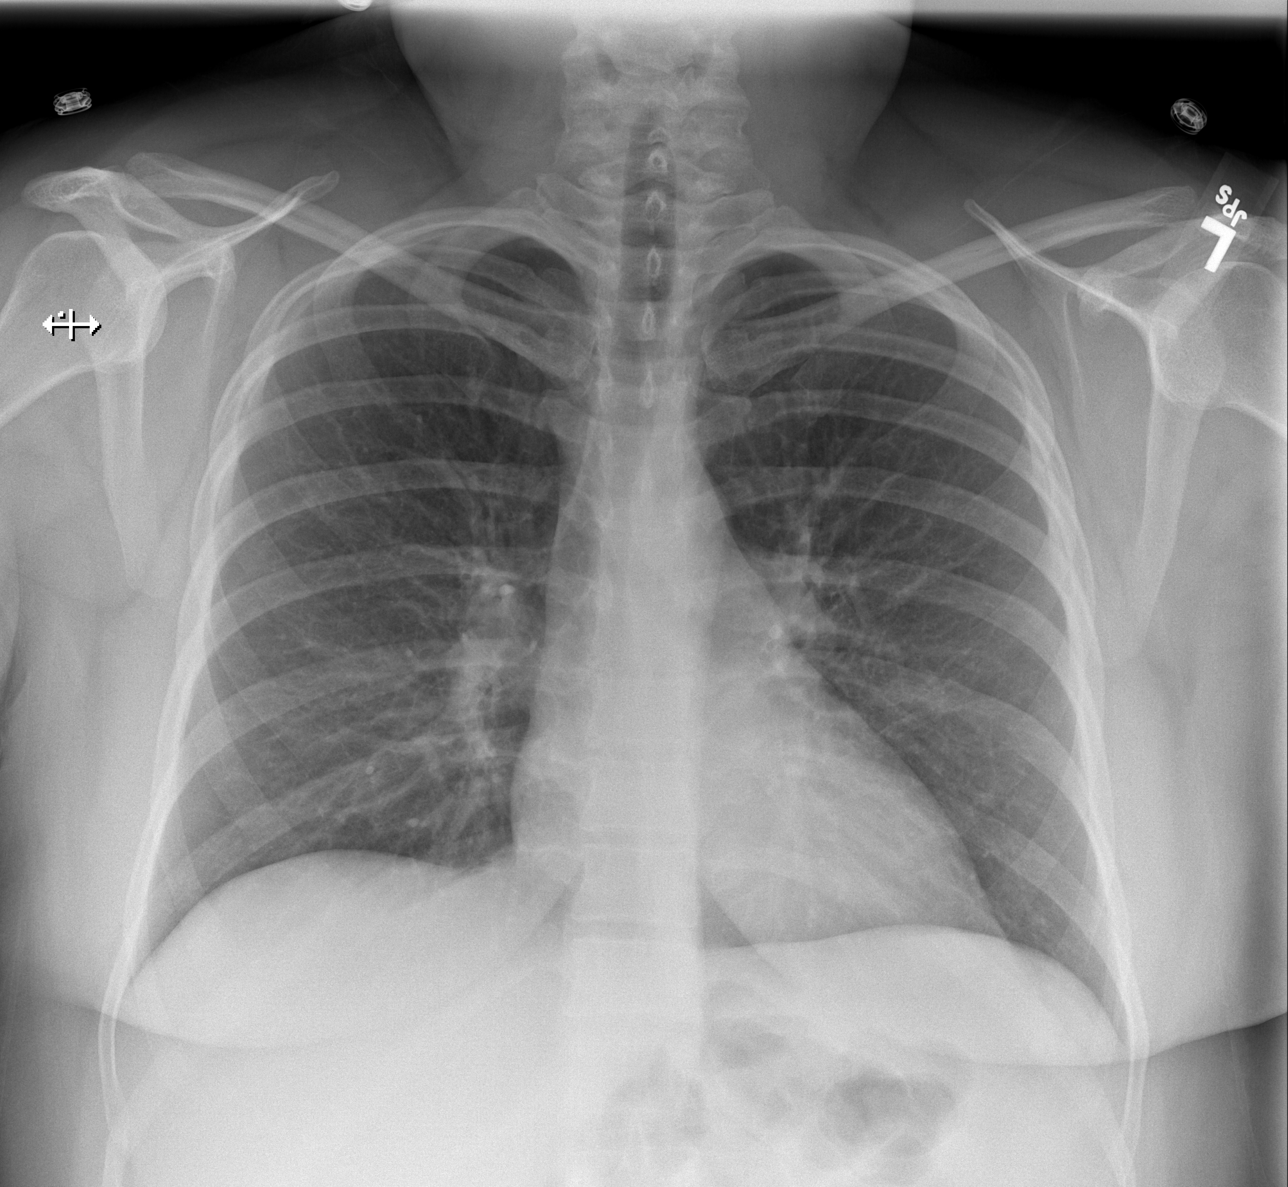

[w abdomen upright]
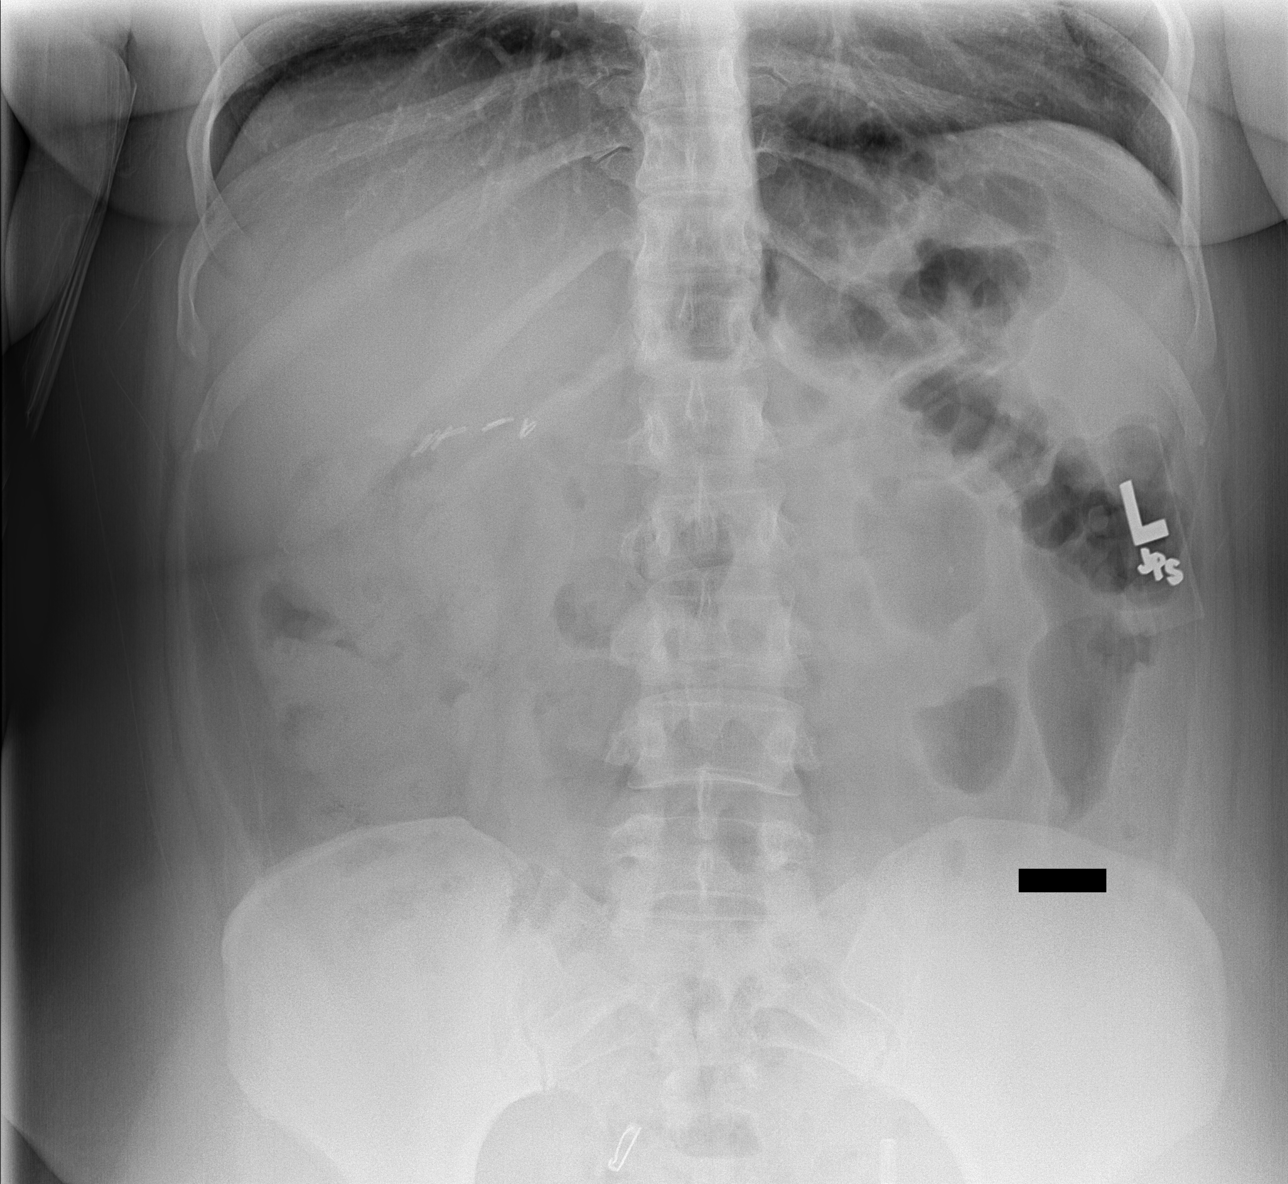

[t abdomen supine (1 of 2)]
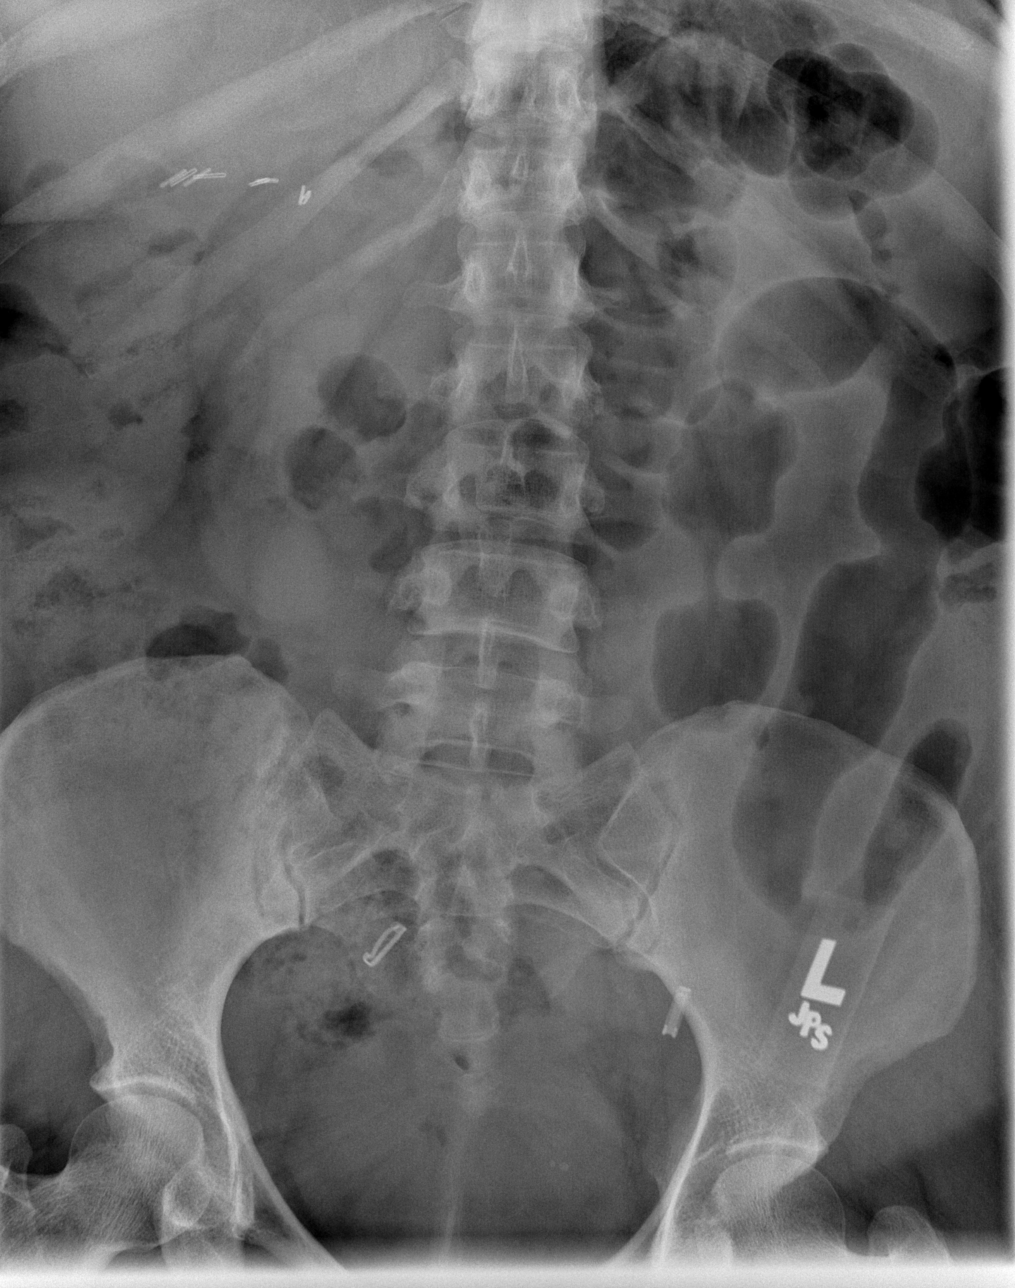

[t abdomen supine (2 of 2)]
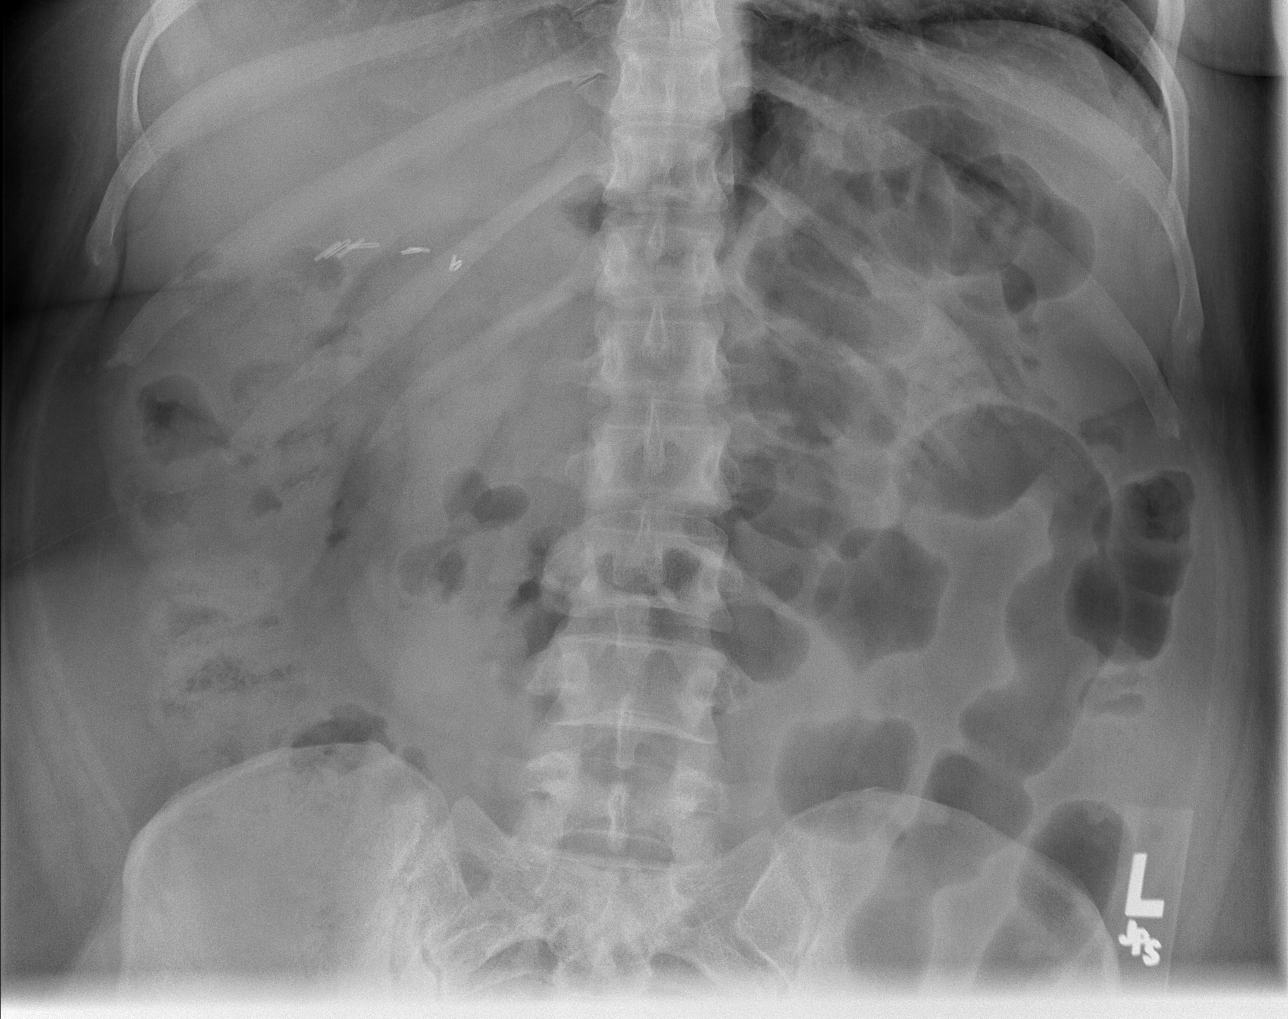

[4 of 4 positions shown; findings below may reference images not displayed]

FINDINGS: Single-view of the chest demonstrates clear lungs and normal heart
size. No pneumothorax or pleural effusion. No acute or focal bony
abnormality.

Two views of the abdomen show no free intraperitoneal air. The bowel
gas pattern is nonobstructive. Tubal ligation and cholecystectomy
clips noted. No abnormal abdominal calcification or bony abnormality
is seen.
IMPRESSION: Negative exam.
